# Patient Record
Sex: Male | Born: 1960
Health system: Southern US, Community
[De-identification: ages and names within clinical notes are randomized; demographics above are authoritative.]

## PROBLEM LIST (undated history)

## (undated) DIAGNOSIS — T7840XA Allergy, unspecified, initial encounter: Secondary | ICD-10-CM

## (undated) DIAGNOSIS — Z8546 Personal history of malignant neoplasm of prostate: Secondary | ICD-10-CM

## (undated) DIAGNOSIS — E785 Hyperlipidemia, unspecified: Secondary | ICD-10-CM

## (undated) DIAGNOSIS — Z87448 Personal history of other diseases of urinary system: Secondary | ICD-10-CM

## (undated) DIAGNOSIS — C61 Malignant neoplasm of prostate: Secondary | ICD-10-CM

## (undated) HISTORY — DX: Personal history of other diseases of urinary system: Z87.448

## (undated) HISTORY — PX: TONSILLECTOMY: SHX5217

## (undated) HISTORY — DX: Allergy, unspecified, initial encounter: T78.40XA

## (undated) HISTORY — DX: Malignant neoplasm of prostate: C61

## (undated) HISTORY — PX: APPENDECTOMY: SHX54

## (undated) HISTORY — DX: Hyperlipidemia, unspecified: E78.5

## (undated) HISTORY — DX: Personal history of malignant neoplasm of prostate: Z85.46

---

## 2003-01-12 ENCOUNTER — Encounter: Payer: Self-pay | Admitting: Family Medicine

## 2003-01-12 ENCOUNTER — Encounter: Admission: RE | Admit: 2003-01-12 | Discharge: 2003-01-12 | Payer: Self-pay | Admitting: Family Medicine

## 2006-01-29 ENCOUNTER — Ambulatory Visit (HOSPITAL_COMMUNITY): Admission: RE | Admit: 2006-01-29 | Discharge: 2006-01-29 | Payer: Self-pay | Admitting: Orthopedic Surgery

## 2006-12-03 ENCOUNTER — Ambulatory Visit: Payer: Self-pay | Admitting: Family Medicine

## 2006-12-03 LAB — CONVERTED CEMR LAB
Glucose, Bld: 81 mg/dL (ref 70–99)
LDL Cholesterol: 126 mg/dL — ABNORMAL HIGH (ref 0–99)
Triglycerides: 76 mg/dL (ref 0–149)
VLDL: 15 mg/dL (ref 0–40)

## 2007-10-07 ENCOUNTER — Ambulatory Visit: Payer: Self-pay | Admitting: Family Medicine

## 2007-10-07 DIAGNOSIS — B350 Tinea barbae and tinea capitis: Secondary | ICD-10-CM | POA: Insufficient documentation

## 2007-10-13 ENCOUNTER — Encounter (INDEPENDENT_AMBULATORY_CARE_PROVIDER_SITE_OTHER): Payer: Self-pay | Admitting: *Deleted

## 2007-10-28 ENCOUNTER — Encounter: Payer: Self-pay | Admitting: Internal Medicine

## 2008-04-13 ENCOUNTER — Ambulatory Visit: Payer: Self-pay | Admitting: Internal Medicine

## 2008-04-14 ENCOUNTER — Encounter: Payer: Self-pay | Admitting: Internal Medicine

## 2008-04-14 ENCOUNTER — Ambulatory Visit: Payer: Self-pay | Admitting: Internal Medicine

## 2008-04-14 DIAGNOSIS — M7120 Synovial cyst of popliteal space [Baker], unspecified knee: Secondary | ICD-10-CM | POA: Insufficient documentation

## 2008-04-14 LAB — CONVERTED CEMR LAB
ALT: 20 units/L (ref 0–53)
Alkaline Phosphatase: 46 units/L (ref 39–117)
BUN: 11 mg/dL (ref 6–23)
Basophils Relative: 0 % (ref 0.0–3.0)
Bilirubin, Direct: 0.1 mg/dL (ref 0.0–0.3)
CO2: 30 meq/L (ref 19–32)
Calcium: 9.1 mg/dL (ref 8.4–10.5)
Chloride: 104 meq/L (ref 96–112)
Cholesterol: 181 mg/dL (ref 0–200)
Eosinophils Absolute: 0.2 10*3/uL (ref 0.0–0.7)
Eosinophils Relative: 4 % (ref 0.0–5.0)
GFR calc Af Amer: 116 mL/min
GFR calc non Af Amer: 96 mL/min
Glucose, Bld: 95 mg/dL (ref 70–99)
HCT: 46.5 % (ref 39.0–52.0)
HDL: 35.7 mg/dL — ABNORMAL LOW (ref >39.0)
Lymphocytes Relative: 33 % (ref 12.0–46.0)
MCV: 91.2 fL (ref 78.0–100.0)
Neutrophils Relative %: 55.5 % (ref 43.0–77.0)
RBC: 5.1 M/uL (ref 4.22–5.81)
TSH: 1.09 microintl units/mL (ref 0.35–5.50)
Total Bilirubin: 1.1 mg/dL (ref 0.3–1.2)
Total CHOL/HDL Ratio: 5.1
VLDL: 14 mg/dL (ref 0–40)

## 2008-12-29 ENCOUNTER — Telehealth: Payer: Self-pay | Admitting: Internal Medicine

## 2009-06-20 ENCOUNTER — Ambulatory Visit: Payer: Self-pay | Admitting: Internal Medicine

## 2009-06-21 ENCOUNTER — Encounter: Payer: Self-pay | Admitting: Internal Medicine

## 2009-08-07 ENCOUNTER — Ambulatory Visit: Payer: Self-pay | Admitting: Family

## 2009-08-07 DIAGNOSIS — J209 Acute bronchitis, unspecified: Secondary | ICD-10-CM | POA: Insufficient documentation

## 2009-08-13 ENCOUNTER — Telehealth: Payer: Self-pay | Admitting: Internal Medicine

## 2009-09-12 ENCOUNTER — Telehealth: Payer: Self-pay | Admitting: Internal Medicine

## 2009-09-18 ENCOUNTER — Encounter: Payer: Self-pay | Admitting: Internal Medicine

## 2009-09-26 ENCOUNTER — Telehealth: Payer: Self-pay | Admitting: Internal Medicine

## 2009-10-26 ENCOUNTER — Telehealth: Payer: Self-pay | Admitting: Internal Medicine

## 2010-07-02 ENCOUNTER — Encounter: Payer: Self-pay | Admitting: Internal Medicine

## 2010-07-02 LAB — CONVERTED CEMR LAB
Calcium: 9.3 mg/dL (ref 8.4–10.5)
HDL: 48 mg/dL (ref >39)
LDL Cholesterol: 103 mg/dL — ABNORMAL HIGH (ref 0–99)
Sodium: 139 meq/L (ref 135–145)
Total CHOL/HDL Ratio: 3.5
VLDL: 16 mg/dL (ref 0–40)

## 2010-07-17 ENCOUNTER — Ambulatory Visit: Payer: Self-pay | Admitting: Internal Medicine

## 2010-08-14 ENCOUNTER — Telehealth: Payer: Self-pay | Admitting: Internal Medicine

## 2010-08-16 ENCOUNTER — Ambulatory Visit
Admission: RE | Admit: 2010-08-16 | Discharge: 2010-08-16 | Payer: Self-pay | Source: Home / Self Care | Attending: Internal Medicine | Admitting: Internal Medicine

## 2010-08-19 ENCOUNTER — Encounter: Payer: Self-pay | Admitting: Internal Medicine

## 2010-08-19 DIAGNOSIS — N529 Male erectile dysfunction, unspecified: Secondary | ICD-10-CM | POA: Insufficient documentation

## 2010-09-01 LAB — CONVERTED CEMR LAB
CO2: 26 meq/L (ref 19–32)
Calcium: 9.2 mg/dL (ref 8.4–10.5)
Chloride: 102 meq/L (ref 96–112)
Creatinine, Ser: 1 mg/dL (ref 0.40–1.50)
Glucose, Bld: 92 mg/dL (ref 70–99)
HDL: 47 mg/dL (ref >39)
LDL Cholesterol: 118 mg/dL — ABNORMAL HIGH (ref 0–99)
Total CHOL/HDL Ratio: 3.9
Triglycerides: 87 mg/dL (ref <150)
VLDL: 17 mg/dL (ref 0–40)

## 2010-09-03 NOTE — Progress Notes (Signed)
Summary: refill to [facility] on [address] Note Refill Request Message from:  Patient on October 26, 2009 2:30 PM  Refills Requested: Medication #1:  PATANOL 0.1 % SOLN one drop each eye two times a day   Dosage confirmed as above?Dosage Confirmed  Medication #2:  ZYRTEC-D ALLERGY & CONGESTION 5-120 MG XR12H-TAB 1/2 tablet by mouth once daily as needed   Dosage confirmed as above?Dosage Confirmed please refill too [facility] on [address]   Initial call taken by: Michaelle Copas,  October 26, 2009 2:31 PM  Follow-up for Phone Call        ok to refill x 5 Follow-up by: D. Thomos Lemons DO,  October 26, 2009 3:36 PM  Additional Follow-up for Phone Call Additional follow up Details #1::        Rx sent to pharmacy Additional Follow-up by: Glendell Docker CMA,  October 26, 2009 4:53 PM    Prescriptions: ZYRTEC-D ALLERGY & CONGESTION 5-120 MG XR12H-TAB (CETIRIZINE-PSEUDOEPHEDRINE) 1/2 tablet by mouth once daily as needed  #30 x 5   Entered by:   Glendell Docker CMA   Authorized by:   D. Thomos Lemons DO   Signed by:   Glendell Docker CMA on 10/26/2009   Method used:   Electronically to        [facility]* (retail)       [address] [place], [place]  [address]       Ph: [phone]       Fax: [phone]   RxID:   [id number] PATANOL 0.1 % SOLN (OLOPATADINE HCL) one drop each eye two times a day  #30 x 5   Entered by:   Glendell Docker CMA   Authorized by:   D. Thomos Lemons DO   Signed by:   Glendell Docker CMA on 10/26/2009   Method used:   Electronically to        [facility]* (retail)       [address] [place], [place]  [address]       Ph: [phone]       Fax: [phone]   RxID:   [id number]

## 2010-09-03 NOTE — Progress Notes (Signed)
  Phone Note Call from Patient   Caller: Patient Details for Reason: Baker's cyst Summary of Call: Pt has a Baker's cyst on right knee , want [facility] referral   [phone] Initial call taken by: Darral Dash,  September 12, 2009 3:50 PM  Follow-up for Phone Call        Appt  [facility]    Dr Cleophas Dunker    Feb  15   Follow-up by: Darral Dash,  September 14, 2009 2:06 PM

## 2010-09-03 NOTE — Progress Notes (Signed)
Summary: needs zyrtec d not zyrtec   Phone Note Refill Request Call back at [phone] Message from:  Patient on September 26, 2009 12:26 PM  His rx came in the mail it is for Zyrtec regular and he Needs Zyrtec D  [facility] [address]    Method Requested: Electronic Next Appointment Scheduled: none Initial call taken by: Roselle Locus,  September 26, 2009 12:27 PM  Follow-up for Phone Call        Rx correction updated Follow-up by: Glendell Docker CMA,  September 26, 2009 1:33 PM    New/Updated Medications: ZYRTEC-D ALLERGY & CONGESTION 5-120 MG XR12H-TAB (CETIRIZINE-PSEUDOEPHEDRINE) 1/2 tablet by mouth once daily as needed Prescriptions: ZYRTEC-D ALLERGY & CONGESTION 5-120 MG XR12H-TAB (CETIRIZINE-PSEUDOEPHEDRINE) 1/2 tablet by mouth once daily as needed  #30 x 3   Entered by:   Glendell Docker CMA   Authorized by:   D. Thomos Lemons DO   Signed by:   Glendell Docker CMA on 09/26/2009   Method used:   Electronically to        [id number]* (retail)       [address]       [place], [place]  [address]       Ph: [phone]       Fax: [phone]   RxID:   [id number]

## 2010-09-03 NOTE — Progress Notes (Signed)
Summary: Rx for Over The Counter med  Phone Note Call from Patient Call back at [phone]   Caller: Patient Summary of Call: patient called and left voice message requesting rx's for  Zyrtec D, Advil and Tylenol. He states that his health care saving will no longer cover these unless he has a prescriptions. Initial call taken by: Glendell Docker CMA,  August 13, 2009 1:35 PM  Follow-up for Phone Call        please find out how pt is taking each med and prepare rx for signature.  Follow-up by: D. Thomos Lemons DO,  August 17, 2009 12:30 PM  Additional Follow-up for Phone Call Additional follow up Details #1::        patient state he  is still having chest congestion and would like to know if he could take another round of antibiotics. He states his cough is better, but he still has some chest congestion Additional Follow-up by: Glendell Docker CMA,  August 17, 2009 1:39 PM    Additional Follow-up for Phone Call Additional follow up Details #2::    If he is still having chest - I suggest pt come in for CXR then OV Follow-up by: D. Thomos Lemons DO,  August 17, 2009 5:50 PM  Additional Follow-up for Phone Call Additional follow up Details #3:: Details for Additional Follow-up Action Taken: attempted to contact patient at [phone] no answer,detailed voice message left advising patient per Dr Artist Pais instructions Additional Follow-up by: Glendell Docker CMA,  August 20, 2009 11:40 AM  New/Updated Medications: ZYRTEC ALLERGY 10 MG TABS (CETIRIZINE HCL) 1/2 tablet by mouth once daily as needed ADVIL 200 MG CAPS (IBUPROFEN) Take 1 tablet by mouth two times a day as needed TYLENOL EXTRA STRENGTH 500 MG TABS (ACETAMINOPHEN) Take 1 tablet by mouth two times a day as needed Prescriptions: TYLENOL EXTRA STRENGTH 500 MG TABS (ACETAMINOPHEN) Take 1 tablet by mouth two times a day as needed  #60 x 3   Entered by:   Glendell Docker CMA   Authorized by:   D. Thomos Lemons DO   Signed by:   Glendell Docker CMA  on 08/17/2009   Method used:   Print then Mail to Patient   RxID:   [id number] ZYRTEC ALLERGY 10 MG TABS (CETIRIZINE HCL) 1/2 tablet by mouth once daily as needed  #30 x 3   Entered by:   Glendell Docker CMA   Authorized by:   D. Thomos Lemons DO   Signed by:   Glendell Docker CMA on 08/17/2009   Method used:   Print then Mail to Patient   RxID:   [id number] ADVIL 200 MG CAPS (IBUPROFEN) Take 1 tablet by mouth two times a day as needed  #60 x 3   Entered by:   Glendell Docker CMA   Authorized by:   D. Thomos Lemons DO   Signed by:   Glendell Docker CMA on 08/17/2009   Method used:   Print then Mail to Patient   RxID:   [id number]

## 2010-09-03 NOTE — Miscellaneous (Signed)
Summary: Orders Update  Clinical Lists Changes  Orders: Added new Test order of T-Lipid Profile [id number]) - Signed Added new Test order of T-Basic Metabolic Panel [phone]) - Signed

## 2010-09-03 NOTE — Assessment & Plan Note (Signed)
Summary: flu like symptoms/mhf   Vital Signs:  Patient profile:   50 year old male Weight:      146 pounds Temp:     98.1 degrees F oral BP sitting:   124 / 74  (right arm)  Vitals Entered By: Doristine Devoid (August 07, 2009 4:27 PM) CC: cough and sinus drainage   Primary Care Provider:  Dondra Spry DO  CC:  cough and sinus drainage.  History of Present Illness: Patient presents today with a 1 week history of cough, "scratchy throat", denies nasal congestion.  Cough productive of yellow phlegm.  Had one episode of streak of blood in sputum.  Wife had the same illness.    Allergies: No Known Drug Allergies  Review of Systems       + cough, no fever    Physical Exam  General:  Well-developed,well-nourished,in no acute distress; alert,appropriate and cooperative throughout examination Head:  Normocephalic and atraumatic without obvious abnormalities. No apparent alopecia or balding. Eyes:  Perrla Ears:  L TM dull, no erythema R ear normal.   Mouth:  Oral mucosa and oropharynx without lesions or exudates.  Teeth in good repair. Neck:  No deformities, masses, or tenderness noted. Lungs:  Normal respiratory effort, chest expands symmetrically. Lungs are clear to auscultation, no crackles or wheezes. Heart:  Normal rate and regular rhythm. S1 and S2 normal without gallop, murmur, click, rub or other extra sounds. Abdomen:  Bowel sounds positive,abdomen soft and non-tender without masses, organomegaly or hernias noted. Msk:  No deformity or scoliosis noted of thoracic or lumbar spine.   Skin:  Intact without suspicious lesions or rashes Psych:  Cognition and judgment appear intact. Alert and cooperative with normal attention span and concentration. No apparent delusions, illusions, hallucinations   Impression & Recommendations:  Problem # 1:  ACUTE BRONCHITIS (ICD-466.0) Assessment New Suspect mild bronchitis will treat with z-pak, patient instructed to call for follow up if  symptoms worsen or do not improve. His updated medication list for this problem includes:    Zithromax Z-pak 250 Mg Tabs (Azithromycin) .[name] Kitchen... 2 tabs by mouth today, then one tablet by mouth daily for 4 more days.  Complete Medication List: 1)  Zyrtec Allergy 10 Mg Tabs (Cetirizine hcl) .... 1/2 tablet by mouth once daily as needed 2)  Rhinocort Aqua 32 Mcg/act Susp (Budesonide) .... One spray to each nostril once daily 3)  Patanol 0.1 % Soln (Olopatadine hcl) .... One drop each eye two times a day 4)  Zithromax Z-pak 250 Mg Tabs (Azithromycin) .... 2 tabs by mouth today, then one tablet by mouth daily for 4 more days.  Patient Instructions: 1)  call if symptoms worsen or do not improve. Prescriptions: ZITHROMAX Z-PAK 250 MG TABS (AZITHROMYCIN) 2 tabs by mouth today, then one tablet by mouth daily for 4 more days.  #1 x 0   Entered and Authorized by:   Lemont Fillers FNP   Signed by:   Lemont Fillers FNP on 08/07/2009   Method used:   Electronically to        [id number]* (retail)       [address]       [place], [place]  [address]       Ph: [phone]       Fax: [phone]   RxID:   [id number]

## 2010-09-03 NOTE — Consult Note (Signed)
Summary: [facility]  [facility]   Imported By: Lanelle Bal 09/27/2009 12:18:14  _____________________________________________________________________  External Attachment:    Type:   Image     Comment:   External Document

## 2010-09-05 NOTE — Progress Notes (Signed)
Summary: PPD Skin Test  Phone Note Call from Patient Call back at [phone]   Caller: Patient Call For: D. Thomos Lemons DO Summary of Call: patient called and left voice message regarding getting a PPD skin test done before his office visit, in order that he may have it read at the office  visit. Call was returned to patient at [phone], no answer. A detailed voice message was left informing patient to call to schedule PPD reading for Friday in order to have it read on Monday. Message was left advising patient to call to schedule nurse visit for placement Initial call taken by: Glendell Docker CMA,  August 14, 2010 12:10 PM

## 2010-09-05 NOTE — Assessment & Plan Note (Signed)
Summary: PPD PLACEMENT/DK  Nurse Visit   Allergies: No Known Drug Allergies  Immunizations Administered:  PPD Skin Test:    Vaccine Type: PPD    Site: left forearm    Mfr: Sanofi Pasteur    Dose: 0.1 ml    Route: ID    Given by: Glendell Docker CMA    Exp. Date: 04/04/2012    Lot #: [id number]  Orders Added: 1)  TB Skin Test [86580] 2)  Admin 1st Vaccine [phone]

## 2010-09-05 NOTE — Letter (Signed)
Summary: Physical Exam Form/[employer]  Physical Exam Form/[employer]   Imported By: Lanelle Bal 08/28/2010 08:21:43  _____________________________________________________________________  External Attachment:    Type:   Image     Comment:   External Document

## 2010-09-11 NOTE — Assessment & Plan Note (Signed)
Summary: John [address]   Vital Signs:  Patient profile:   50 year old male Height:      65 inches Weight:      147.50 pounds BMI:     24.63 O2 Sat:      100 % on Room air Temp:     97.8 degrees F oral Pulse rate:   98 / minute Resp:     18 per minute BP sitting:   110 / 70  (right arm) Cuff size:   large  Vitals Entered By: Glendell Docker CMA (August 19, 2010 2:05 PM)  O2 Flow:  Room air  Primary Care Provider:  D. Thomos Lemons DO   History of Present Illness: 50 y/o male for routine John   Preventive Screening-Counseling & Management  Alcohol-Tobacco     Alcohol drinks/day: <1     Smoking Status: quit  Caffeine-Diet-Exercise     Caffeine use/day: 2-3 beverages daily     Does Patient Exercise: yes     Exercise (avg: min/session): 30-60     Times/week: 6  Allergies (verified): No Known Drug Allergies  Past History:  Past Surgical History: Appendectomy  Tonsillectomy    Family History: Family History of CAD Male 1st degree relative <50 (Brother)   smoker and drinker Family History Diabetes 1st degree relative - Father Family History Hypertension -  Mother and Father    Social History: Married Former Smoker quit 18 yrs ago  smoked for 10 yrs - 1/2 ppd Alcohol use-yes 3 children (sons)    Review of Systems  The patient denies fever, weight loss, weight gain, chest pain, syncope, dyspnea on exertion, prolonged cough, abdominal pain, melena, hematochezia, severe indigestion/heartburn, genital sores, and depression.    Physical Exam  General:  alert, well-developed, and well-nourished.   Head:  normocephalic and atraumatic.   Eyes:  pupils equal, pupils round, and pupils reactive to light.   Ears:  R ear normal and L ear normal.   Mouth:  pharynx pink and moist and no erythema.   Neck:  No deformities, masses, or tenderness noted. Lungs:  normal respiratory effort and normal breath sounds.   Heart:  normal rate, regular rhythm, and no gallop.   Abdomen:   soft, non-tender, normal bowel sounds, no masses, no hepatomegaly, and no splenomegaly.   Extremities:  No lower extremity edema Psych:  normally interactive, good eye contact, not anxious appearing, and not depressed appearing.     Impression & Recommendations:  Problem # 1:  HEALTH MAINTENANCE EXAM (ICD-V70.0) Reviewed adult health maintenance protocols.  Td Booster: Tdap (02/14/2003)   Flu Vax: Fluvax Non-MCR (06/20/2009)   Chol: 167 (07/02/2010)   HDL: 48 (07/02/2010)   LDL: 103 (07/02/2010)   TG: 79 (07/02/2010) TSH: 1.09 (04/14/2008)   PSA: 1.60 (12/03/2006)  Problem # 2:  ERECTILE DYSFUNCTION, ORGANIC (ICD-607.84)  His updated medication list for this problem includes:    Cialis 5 Mg Tabs (Tadalafil) ..... One by mouth once daily as needed  Complete Medication List: 1)  Zyrtec-d Allergy & Congestion 5-120 Mg Xr12h-tab (Cetirizine-pseudoephedrine) .... 1/2 tablet by mouth once daily as needed 2)  Rhinocort Aqua 32 Mcg/act Susp (Budesonide) .... One spray to each nostril once daily 3)  Patanol 0.1 % Soln (Olopatadine hcl) .... One drop each eye two times a day 4)  Advil 200 Mg Caps (Ibuprofen) .... Take 1 tablet by mouth two times a day as needed 5)  Tylenol Extra Strength 500 Mg Tabs (Acetaminophen) .... Take 1 tablet  by mouth two times a day as needed 6)  Cialis 5 Mg Tabs (Tadalafil) .... One by mouth once daily as needed  Patient Instructions: 1)  Please schedule a follow-up appointment in 1 year. 2)  Please return for lab work one (1) week before your next appointment.  3)  BMP prior to visit, ICD-9: v70 4)  Lipid Panel prior to visit, ICD-9: v70 5)  PSA prior to visit, ICD-9: v76.44 6)  Total testosterone level:  607.84 Prescriptions: CIALIS 5 MG TABS (TADALAFIL) one by mouth once daily as needed  #30 x 0   Entered and Authorized by:   D. Thomos Lemons DO   Signed by:   D. Thomos Lemons DO on 08/19/2010   Method used:   Print then Give to Patient   RxID:    [id number]    Orders Added: 1)  Est. Patient 40-64 years [99396]   PPD Results    Date of reading: 08/19/2010    Results: < 5mm    Interpretation: negative    Current Allergies (reviewed today): No known allergies

## 2010-10-04 ENCOUNTER — Telehealth: Payer: Self-pay | Admitting: Internal Medicine

## 2010-10-10 NOTE — Progress Notes (Signed)
Summary: Zyrtex- Out of Town refill  Phone Note Call from Patient Call back at [phone]   Caller: Patient Call For: D. 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 He would like to know if Dr Camie Patience authorize a refill to [facility] [phone]) Initial call taken by: Glendell Docker CMA,  October 04, 2010 3:54 PM  Follow-up for Phone Call        ok to send rx  Follow-up by: D. Thomos Lemons DO,  October 04, 2010 4:36 PM  Additional Follow-up for Phone Call Additional follow up Details #1::        call place to patient at [phone], no answer. A detailed voice message  was  left informing patient rx sent to pharmacy Additional Follow-up by: Glendell Docker CMA,  October 04, 2010 4:52 PM    Prescriptions: ZYRTEC-D ALLERGY & CONGESTION 5-120 MG XR12H-TAB (CETIRIZINE-PSEUDOEPHEDRINE) 1/2 tablet by mouth once daily as needed  #30 x 0   Entered by:   Glendell Docker CMA   Authorized by:   D. Thomos Lemons DO   Signed by:   Glendell Docker CMA on 10/04/2010   Method used:   Printed then faxed to ...       [facility]* (retail)       [address] [place], [place]  [address]       Ph: [phone]       Fax: [phone]   RxID:   [id number]

## 2011-01-22 ENCOUNTER — Other Ambulatory Visit: Payer: Self-pay | Admitting: Internal Medicine

## 2011-01-24 ENCOUNTER — Telehealth: Payer: Self-pay | Admitting: Internal Medicine

## 2011-01-24 MED ORDER — CETIRIZINE-PSEUDOEPHEDRINE ER 5-120 MG PO TB12: ORAL_TABLET | ORAL | Status: DC

## 2011-01-24 NOTE — Telephone Encounter (Signed)
Refills sent to pharmacy. 

## 2011-01-24 NOTE — Telephone Encounter (Signed)
Pt states that [facility] just [facility] and the pharmacy has told him that they have not received the zyrtec-d rx.

## 2011-01-24 NOTE — Telephone Encounter (Signed)
Rx sent to pharmacy   

## 2011-01-24 NOTE — Telephone Encounter (Signed)
Please refill as directed

## 2011-01-24 NOTE — Telephone Encounter (Signed)
OK to refill all three medications, disp same sig and same amount but with just 2 refills

## 2011-01-24 NOTE — Telephone Encounter (Signed)
Patient states he needs rx's called into [facility] by today because he will be going out of town this weekend. Pt also states that he needs a refill for Zyrtec-D.

## 2011-07-09 ENCOUNTER — Telehealth: Payer: Self-pay | Admitting: Internal Medicine

## 2011-07-09 MED ORDER — PSEUDOEPHEDRINE-IBUPROFEN 30-200 MG PO CAPS: ORAL_CAPSULE | ORAL | Status: DC

## 2011-07-09 NOTE — Telephone Encounter (Signed)
Dr Rodena Medin would it be okay to provide this Rx for patient?

## 2011-07-09 NOTE — Telephone Encounter (Signed)
ok 

## 2011-07-09 NOTE — Telephone Encounter (Signed)
Rx sent to pharmacy   

## 2011-07-09 NOTE — Telephone Encounter (Signed)
Patient states that he would like to buy advil with his Flex Spending(Benny card) but needs a prescription for the med. Patient would like Dr. Rodena Medin to write him an Rx for Advil liquid gel #160. [facility] [address].

## 2011-07-11 ENCOUNTER — Telehealth: Payer: Self-pay | Admitting: *Deleted

## 2011-07-11 MED ORDER — IBUPROFEN 200 MG PO TABS: 200.0000 mg | ORAL_TABLET | Freq: Four times a day (QID) | ORAL | Status: AC | PRN

## 2011-07-11 NOTE — Telephone Encounter (Signed)
Patient called and left voice message stating he has received the medication that was sent to the pharmacy. However he would like to know if he could get a rx for over the counter advil/ibuprofen as well.  Over the counter Rx sent to pharmacy.

## 2011-07-15 ENCOUNTER — Telehealth: Payer: Self-pay | Admitting: Internal Medicine

## 2011-07-15 MED ORDER — ASPIRIN 81 MG PO TABS: 81.0000 mg | ORAL_TABLET | ORAL | Status: DC | PRN

## 2011-07-15 NOTE — Telephone Encounter (Signed)
Is it okay to provide a Rx for over the counter medication per patient request?

## 2011-07-15 NOTE — Telephone Encounter (Signed)
Patient would like an rx for Bayer Aspirin 81mg  qty 240 so he can pay with his benny card(spending account). [facility] [address].

## 2011-07-15 NOTE — Telephone Encounter (Signed)
Rx sent to pharmacy   

## 2011-07-15 NOTE — Telephone Encounter (Signed)
yes

## 2011-07-22 ENCOUNTER — Telehealth: Payer: Self-pay | Admitting: *Deleted

## 2011-07-22 MED ORDER — ZYRTEC-D ALLERGY & CONGESTION 5-120 MG PO TB12: ORAL_TABLET | ORAL | Status: DC

## 2011-07-22 NOTE — Telephone Encounter (Signed)
Patient called and left voice message requesting a Rx for Zyrtec D 24 tablet. He stated that he will run out prior to his January appointment and would like to know if he could get refills on the medication.   Per Dr Rodena Medin approval. Molli Knock to provide to patient.   Rx for Zyrtec sent to pharmacy.

## 2011-07-23 ENCOUNTER — Telehealth: Payer: Self-pay | Admitting: Internal Medicine

## 2011-07-23 MED ORDER — ZYRTEC-D ALLERGY & CONGESTION 5-120 MG PO TB12: 1.0000 | ORAL_TABLET | Freq: Every day | ORAL | Status: DC | PRN

## 2011-07-23 NOTE — Telephone Encounter (Signed)
Zyrtec-d allergy and congestion 5-120mg  per tablet. Qty 30 1/2 tablet by mouth daily as needed.  Pharmacy comments:  Cannot cut these in half. What would you like to do?

## 2011-07-23 NOTE — Telephone Encounter (Signed)
Corrected Rx resubmitted to pharmacy.

## 2011-07-25 ENCOUNTER — Encounter: Payer: Self-pay | Admitting: Internal Medicine

## 2011-07-31 ENCOUNTER — Other Ambulatory Visit: Payer: Self-pay | Admitting: Family Medicine

## 2011-08-22 ENCOUNTER — Encounter: Payer: Self-pay | Admitting: Internal Medicine

## 2011-08-25 ENCOUNTER — Other Ambulatory Visit: Payer: Self-pay | Admitting: Internal Medicine

## 2011-08-25 DIAGNOSIS — Z125 Encounter for screening for malignant neoplasm of prostate: Secondary | ICD-10-CM

## 2011-08-25 DIAGNOSIS — Z0000 Encounter for general adult medical examination without abnormal findings: Secondary | ICD-10-CM

## 2011-08-25 LAB — LIPID PANEL
LDL Cholesterol: 105 mg/dL — ABNORMAL HIGH (ref 0–99)
Triglycerides: 63 mg/dL (ref <150)
VLDL: 13 mg/dL (ref 0–40)

## 2011-08-25 LAB — BASIC METABOLIC PANEL
BUN: 14 mg/dL (ref 6–23)
CO2: 26 mEq/L (ref 19–32)
Chloride: 101 mEq/L (ref 96–112)
Potassium: 4.1 mEq/L (ref 3.5–5.3)

## 2011-08-25 LAB — CBC
HCT: 44.1 % (ref 39.0–52.0)
Hemoglobin: 15.1 g/dL (ref 13.0–17.0)
MCH: 30.6 pg (ref 26.0–34.0)
MCHC: 34.2 g/dL (ref 30.0–36.0)
MCV: 89.3 fL (ref 78.0–100.0)

## 2011-08-25 LAB — HEPATIC FUNCTION PANEL
Albumin: 4.1 g/dL (ref 3.5–5.2)
Indirect Bilirubin: 0.4 mg/dL (ref 0.0–0.9)
Total Bilirubin: 0.5 mg/dL (ref 0.3–1.2)
Total Protein: 6.5 g/dL (ref 6.0–8.3)

## 2011-08-26 LAB — URINALYSIS, ROUTINE W REFLEX MICROSCOPIC
Leukocytes, UA: NEGATIVE
Nitrite: NEGATIVE
Specific Gravity, Urine: 1.007 (ref 1.005–1.030)
pH: 7 (ref 5.0–8.0)

## 2011-08-26 LAB — URINALYSIS, MICROSCOPIC ONLY
Casts: NONE SEEN
Crystals: NONE SEEN

## 2011-08-26 LAB — PSA: PSA: 5.17 ng/mL — ABNORMAL HIGH (ref <=4.00)

## 2011-08-28 ENCOUNTER — Ambulatory Visit (INDEPENDENT_AMBULATORY_CARE_PROVIDER_SITE_OTHER): Payer: Self-pay | Admitting: Internal Medicine

## 2011-08-28 ENCOUNTER — Encounter: Payer: Self-pay | Admitting: Internal Medicine

## 2011-08-28 VITALS — BP 122/90 | HR 95 | Temp 98.1°F | Resp 18 | Ht 65.0 in | Wt 146.0 lb

## 2011-08-28 DIAGNOSIS — N529 Male erectile dysfunction, unspecified: Secondary | ICD-10-CM

## 2011-08-28 DIAGNOSIS — Z23 Encounter for immunization: Secondary | ICD-10-CM

## 2011-08-28 DIAGNOSIS — Z8546 Personal history of malignant neoplasm of prostate: Secondary | ICD-10-CM | POA: Insufficient documentation

## 2011-08-28 DIAGNOSIS — Z0000 Encounter for general adult medical examination without abnormal findings: Secondary | ICD-10-CM | POA: Insufficient documentation

## 2011-08-28 DIAGNOSIS — Z1211 Encounter for screening for malignant neoplasm of colon: Secondary | ICD-10-CM

## 2011-08-28 DIAGNOSIS — R972 Elevated prostate specific antigen [PSA]: Secondary | ICD-10-CM

## 2011-08-28 HISTORY — DX: Personal history of malignant neoplasm of prostate: Z85.46

## 2011-08-28 MED ORDER — TADALAFIL 5 MG PO TABS: 5.0000 mg | ORAL_TABLET | Freq: Every day | ORAL | Status: DC | PRN

## 2011-08-28 NOTE — Assessment & Plan Note (Signed)
Voucher and prescription provided for cialis 5mg . Aware of correct dosing instructions.

## 2011-08-28 NOTE — Progress Notes (Signed)
  Subjective:    Patient ID: John Lane, male    DOB: 07-12-1961, 51 y.o.   MRN: [id number]  HPI Pt presents to clinic for annual physical exam. Has [employer] physical form to be completed. Has ED s/p attempt of cialis 5mg  in the past without adverse effects. Has not undergone colonoscopy-asx and no fam hx of colon cancer. Reviewed cpe labs including mildly elevated psa 5.17 for which he is asx. Last psa for comparison 1.6 ~4 years ago. BP minimally elevated and states is typically normotensive. No other complaints.  No past medical history on file. Past Surgical History  Procedure Date  . Appendectomy   . Tonsillectomy     reports that he has quit smoking. He has never used smokeless tobacco. He reports that he drinks alcohol. He reports that he does not use illicit drugs. family history includes Coronary artery disease in his brother; Diabetes in his father; and Hypertension in his father and mother. No Known Allergies   Review of Systems see hpi     Objective:   Physical Exam  Physical Exam  Nursing note and vitals reviewed. Constitutional: He appears well-developed and well-nourished. No distress.  HENT:  Head: Normocephalic and atraumatic.  Right Ear: Tympanic membrane and external ear normal.  Left Ear: Tympanic membrane and external ear normal.  Nose: Nose normal.  Mouth/Throat: Uvula is midline, oropharynx is clear and moist and mucous membranes are normal. No oropharyngeal exudate.  Eyes: Conjunctivae and EOM are normal. Pupils are equal, round, and reactive to light. Right eye exhibits no discharge. Left eye exhibits no discharge. No scleral icterus.  Neck: Neck supple. Carotid bruit is not present. No thyromegaly present.  Cardiovascular: Normal rate, regular rhythm and normal heart sounds.  Exam reveals no gallop and no friction rub.   No murmur heard. Pulmonary/Chest: Effort normal and breath sounds normal. No respiratory distress. He has no wheezes. He has no rales.    Abdominal: Soft. He exhibits no distension and no mass. There is no hepatosplenomegaly. There is no tenderness. There is no rebound. Hernia confirmed negative in the right inguinal area and confirmed negative in the left inguinal area.  Genitourinary: Rectum normal, prostate normal and testes normal. Rectal exam shows no mass and no tenderness. Guaiac negative stool. Prostate is not enlarged and not tender. Right testis shows no mass, no swelling and no tenderness. Right testis is descended. Left testis shows no mass, no swelling and no tenderness. Left testis is descended.  Lymphadenopathy:    He has no cervical adenopathy.       Right: No inguinal adenopathy present.       Left: No inguinal adenopathy present.  Neurological: He is alert.  Skin: Skin is warm and dry. He is not diaphoretic.  Psychiatric: He has a normal mood and affect.       Assessment & Plan:

## 2011-08-28 NOTE — Assessment & Plan Note (Signed)
Nl exam. Recommend outpt bp log. Refer for screening colonoscopy

## 2011-08-28 NOTE — Patient Instructions (Signed)
Please schedule fasting labs prior to your next year's physical Cbc, chem7, lipid, lft, tsh, urinalysis with reflex, psa -v70.0

## 2011-08-28 NOTE — Assessment & Plan Note (Signed)
Discussed implications of elevated psa including potential prostate cancer as wel as false positive result. Proceed with urology consult for further evaluation

## 2011-08-29 ENCOUNTER — Encounter: Payer: Self-pay | Admitting: Internal Medicine

## 2011-09-29 ENCOUNTER — Other Ambulatory Visit: Payer: Self-pay | Admitting: *Deleted

## 2011-09-29 MED ORDER — ZYRTEC-D ALLERGY & CONGESTION 5-120 MG PO TB12: 1.0000 | ORAL_TABLET | Freq: Every day | ORAL | Status: DC | PRN

## 2011-09-29 MED ORDER — ASPIRIN 81 MG PO TABS: 81.0000 mg | ORAL_TABLET | ORAL | Status: DC | PRN

## 2011-09-29 NOTE — Telephone Encounter (Signed)
Patient called and left voice message requesting a refill on Zytrec D  And Monia Sabal to his pharmacy.  Rx refills sent to pharmacy.

## 2011-10-02 ENCOUNTER — Other Ambulatory Visit: Payer: Self-pay | Admitting: Internal Medicine

## 2011-10-02 ENCOUNTER — Other Ambulatory Visit: Payer: Self-pay | Admitting: *Deleted

## 2011-10-02 MED ORDER — BUDESONIDE 32 MCG/ACT NA SUSP: 1.0000 | Freq: Every day | NASAL | Status: DC

## 2011-10-02 MED ORDER — CICLESONIDE 50 MCG/ACT NA SUSP: 2.0000 | Freq: Every day | NASAL | Status: DC

## 2011-10-02 NOTE — Telephone Encounter (Signed)
Call placed to patient at [phone], no answer. A detailed voice message was left informing patient per Dr Rodena Medin instructions. He was advised to call back if any questions.  Rx for [facility] sent to pharmacy.

## 2011-10-02 NOTE — Telephone Encounter (Signed)
Patient called and left voice message requesting a refill on Rhinocort nasal spray to [facility] pharmacy. He is also requesting samples of Solaris nasal spray if available.   Call returned to patient at [phone], he was informed no samples of requested nasal spray is available. He would like to know if Dr Rodena Medin would provide him a Rx for Solaris nasal spray to have on hand to try. If approved he is requesting Rx to [facility]

## 2011-10-02 NOTE — Telephone Encounter (Signed)
Not sure what solaris. If omnaris then 2 sprays each nostril qd #1  rf 6. But don't use it with other inhaled steroids like nasacort, nasonex, flonase, veramyst

## 2011-10-10 ENCOUNTER — Other Ambulatory Visit: Payer: Self-pay | Admitting: Internal Medicine

## 2011-10-10 NOTE — Telephone Encounter (Signed)
Rx refill sent to pharmacy. 

## 2011-10-13 ENCOUNTER — Ambulatory Visit (AMBULATORY_SURGERY_CENTER): Payer: BC Managed Care – PPO | Admitting: *Deleted

## 2011-10-13 ENCOUNTER — Encounter: Payer: Self-pay | Admitting: Gastroenterology

## 2011-10-13 ENCOUNTER — Telehealth: Payer: Self-pay | Admitting: *Deleted

## 2011-10-13 VITALS — Ht 65.0 in | Wt 149.0 lb

## 2011-10-13 DIAGNOSIS — Z1211 Encounter for screening for malignant neoplasm of colon: Secondary | ICD-10-CM

## 2011-10-13 MED ORDER — PEG-KCL-NACL-NASULF-NA ASC-C 100 G PO SOLR: ORAL | Status: DC

## 2011-10-13 NOTE — Telephone Encounter (Signed)
Explained Moderate sedation and answered his questions.

## 2011-10-27 ENCOUNTER — Telehealth: Payer: Self-pay | Admitting: *Deleted

## 2011-10-27 MED ORDER — IBUPROFEN 200 MG PO CAPS: 200.0000 mg | ORAL_CAPSULE | Freq: Four times a day (QID) | ORAL | Status: DC | PRN

## 2011-10-27 MED ORDER — ACETAMINOPHEN 500 MG PO TABS: 500.0000 mg | ORAL_TABLET | Freq: Four times a day (QID) | ORAL | Status: DC | PRN

## 2011-10-27 NOTE — Telephone Encounter (Signed)
Ok 1 q6 hours prn pain for both

## 2011-10-27 NOTE — Telephone Encounter (Signed)
Rx sent to pharmacy   

## 2011-10-27 NOTE — Telephone Encounter (Signed)
Patient called and left voice message requesting a Rx to [facility] for Advil liquid gels qty 240 and extra strength Tylenol qty 325. He stated that he would like to use his flex spending and a Rx is needed in order for him to do that.

## 2011-10-30 ENCOUNTER — Encounter: Payer: Self-pay | Admitting: Gastroenterology

## 2011-10-30 ENCOUNTER — Ambulatory Visit (AMBULATORY_SURGERY_CENTER): Payer: BC Managed Care – PPO | Admitting: Gastroenterology

## 2011-10-30 VITALS — BP 135/97 | HR 77 | Temp 96.4°F | Resp 20 | Ht 65.0 in | Wt 149.0 lb

## 2011-10-30 DIAGNOSIS — D126 Benign neoplasm of colon, unspecified: Secondary | ICD-10-CM

## 2011-10-30 DIAGNOSIS — Z1211 Encounter for screening for malignant neoplasm of colon: Secondary | ICD-10-CM

## 2011-10-30 MED ORDER — SODIUM CHLORIDE 0.9 % IV SOLN: 500.0000 mL | INTRAVENOUS | Status: DC

## 2011-10-30 NOTE — Progress Notes (Signed)
Patient did not have preoperative order for IV antibiotic SSI prophylaxis. (G8918)  Patient did not experience any of the following events: a burn prior to discharge; a fall within the facility; wrong site/side/patient/procedure/implant event; or a hospital transfer or hospital admission upon discharge from the facility. (G8907)  

## 2011-10-30 NOTE — Patient Instructions (Signed)
YOU HAD AN ENDOSCOPIC PROCEDURE TODAY AT THE [facility]: Refer to the procedure report that was given to you for any specific questions about what was found during the examination.  If the procedure report does not answer your questions, please call your gastroenterologist to clarify.  If you requested that your care partner not be given the details of your procedure findings, then the procedure report has been included in a sealed envelope for you to review at your convenience later.  YOU SHOULD EXPECT: Some feelings of bloating in the abdomen. Passage of more gas than usual.  Walking can help get rid of the air that was put into your GI tract during the procedure and reduce the bloating. If you had a lower endoscopy (such as a colonoscopy or flexible sigmoidoscopy) you may notice spotting of blood in your stool or on the toilet paper. If you underwent a bowel prep for your procedure, then you may not have a normal bowel movement for a few days.  DIET: Your first meal following the procedure should be a light meal and then it is ok to progress to your normal diet.  A half-sandwich or bowl of soup is an example of a good first meal.  Heavy or fried foods are harder to digest and may make you feel nauseous or bloated.  Likewise meals heavy in dairy and vegetables can cause extra gas to form and this can also increase the bloating.  Drink plenty of fluids but you should avoid alcoholic beverages for 24 hours.  ACTIVITY: Your care partner should take you home directly after the procedure.  You should plan to take it easy, moving slowly for the rest of the day.  You can resume normal activity the day after the procedure however you should NOT DRIVE or use heavy machinery for 24 hours (because of the sedation medicines used during the test).    SYMPTOMS TO REPORT IMMEDIATELY: A gastroenterologist can be reached at any hour.  During normal business hours, 8:30 AM to 5:00 PM Monday through Friday,  call [phone].  After hours and on weekends, please call the GI answering service at [phone] who will take a message and have the physician on call contact you.   Following lower endoscopy (colonoscopy or flexible sigmoidoscopy):  Excessive amounts of blood in the stool  Significant tenderness or worsening of abdominal pains  Swelling of the abdomen that is new, acute  Fever of 100F or higher    FOLLOW UP: If any biopsies were taken you will be contacted by phone or by letter within the next 1-3 weeks.  Call your gastroenterologist if you have not heard about the biopsies in 3 weeks.  Our staff will call the home number listed on your records the next business day following your procedure to check on you and address any questions or concerns that you may have at that time regarding the information given to you following your procedure. This is a courtesy call and so if there is no answer at the home number and we have not heard from you through the emergency physician on call, we will assume that you have returned to your regular daily activities without incident.  SIGNATURES/CONFIDENTIALITY: You and/or your care partner have signed paperwork which will be entered into your electronic medical record.  These signatures attest to the fact that that the information above on your After Visit Summary has been reviewed and is understood.  Full responsibility of the confidentiality   of this discharge information lies with you and/or your care-partner.     

## 2011-10-30 NOTE — Op Note (Signed)
[facility] [address] [place], [place]  [address]  COLONOSCOPY PROCEDURE REPORT  PATIENT:  John Lane, John Lane  MR#:  [id number] BIRTHDATE:  [birth year], 50 yrs. old  GENDER:  male ENDOSCOPIST:  Judie Petit T. Russella Dar, MD, [facility] Referred by:  Charlynn Court, M.D. PROCEDURE DATE:  10/30/2011 PROCEDURE:  Colonoscopy with snare polypectomy ASA CLASS:  Class II INDICATIONS:  1) Routine Risk Screening MEDICATIONS:   These medications were titrated to patient response per physician's verbal order, Fentanyl 75 mcg IV, Versed 7 mg IV DESCRIPTION OF PROCEDURE:   After the risks benefits and alternatives of the procedure were thoroughly explained, informed consent was obtained.  Digital rectal exam was performed and revealed no abnormalities.   The LB CF-[id number] endoscope was introduced through the anus and advanced to the cecum, which was identified by both the appendix and ileocecal valve, without limitations.  The quality of the prep was excellent, using MoviPrep.  The instrument was then slowly withdrawn as the colon was fully examined. <<PROCEDUREIMAGES>> FINDINGS:  A sessile polyp was found in the descending colon. It was 5 mm in size. Polyp was snared without cautery. Retrieval was successful.  Otherwise normal colonoscopy without other polyps, masses, vascular ectasias, or inflammatory changes.  Retroflexed views in the rectum revealed no abnormalities. The time to cecum = 2.75  minutes. The scope was then withdrawn (time =  9.5  min) from the patient and the procedure completed.  COMPLICATIONS:  None  ENDOSCOPIC IMPRESSION: 1) 5 mm sessile polyp in the descending colon  RECOMMENDATIONS: 1) Await pathology results 2) If the polyp is adenomatous (pre-cancerous), repeat colonoscopy in 5 years. Otherwise follow colorectal cancer screening guidelines for "routine risk" patients with colonoscopy in 10 years.  Venita Lick. Russella Dar, MD, [facility]  n. eSIGNED:   Venita Lick. Kayline Sheer at  10/30/2011 08:52 AM  Carlton Adam, [phone]

## 2011-11-03 ENCOUNTER — Telehealth: Payer: Self-pay | Admitting: *Deleted

## 2011-11-03 NOTE — Telephone Encounter (Signed)
Message to call as needed.

## 2011-11-06 ENCOUNTER — Encounter: Payer: Self-pay | Admitting: Gastroenterology

## 2011-12-24 DIAGNOSIS — C61 Malignant neoplasm of prostate: Secondary | ICD-10-CM

## 2011-12-24 HISTORY — DX: Malignant neoplasm of prostate: C61

## 2012-01-07 ENCOUNTER — Other Ambulatory Visit (HOSPITAL_COMMUNITY): Payer: Self-pay | Admitting: Urology

## 2012-01-07 DIAGNOSIS — C61 Malignant neoplasm of prostate: Secondary | ICD-10-CM

## 2012-01-30 ENCOUNTER — Encounter (HOSPITAL_COMMUNITY)
Admission: RE | Admit: 2012-01-30 | Discharge: 2012-01-30 | Disposition: A | Discharge status: Home / Self Care | Payer: BC Managed Care – PPO | Source: Ambulatory Visit | Attending: Urology | Admitting: Urology

## 2012-01-30 ENCOUNTER — Encounter (HOSPITAL_COMMUNITY): Payer: Self-pay

## 2012-01-30 DIAGNOSIS — C61 Malignant neoplasm of prostate: Secondary | ICD-10-CM | POA: Insufficient documentation

## 2012-01-30 MED ORDER — TECHNETIUM TC 99M MEDRONATE IV KIT
23.4000 | PACK | Freq: Once | INTRAVENOUS | Status: AC | PRN
  Administered 2012-01-30: 23.4 via INTRAVENOUS

## 2012-02-27 ENCOUNTER — Encounter: Payer: Self-pay | Admitting: Radiation Oncology

## 2012-03-01 ENCOUNTER — Ambulatory Visit: Payer: BC Managed Care – PPO | Admitting: Radiation Oncology

## 2012-03-01 ENCOUNTER — Ambulatory Visit: Payer: BC Managed Care – PPO

## 2012-03-09 ENCOUNTER — Other Ambulatory Visit: Payer: Self-pay | Admitting: *Deleted

## 2012-03-09 MED ORDER — TADALAFIL 5 MG PO TABS: ORAL_TABLET | ORAL | Status: DC

## 2012-03-09 NOTE — Telephone Encounter (Signed)
Ok to rf as previous

## 2012-03-09 NOTE — Telephone Encounter (Signed)
Patient request Samples of Cialis, none available; Ok to send Rx to pharmacy/SLS Please advise.

## 2012-03-09 NOTE — Telephone Encounter (Signed)
Rx sent to pharmacy; [facility] with contact name & number to inform patient/SLS

## 2012-03-15 ENCOUNTER — Other Ambulatory Visit: Payer: Self-pay | Admitting: Urology

## 2012-04-15 ENCOUNTER — Other Ambulatory Visit: Payer: Self-pay | Admitting: Urology

## 2012-05-07 ENCOUNTER — Encounter (HOSPITAL_COMMUNITY): Payer: Self-pay | Admitting: Pharmacy Technician

## 2012-05-07 NOTE — Patient Instructions (Signed)
[address]  05/07/2012   Your procedure is scheduled on:  05/12/12 0830am-1240pm  Report to [facility] at 0630 AM.  Call this number if you have problems the morning of surgery: [phone]   Remember:   Do not eat food:After Midnight.  May have clear liquids:until Midnight .    Take these medicines the morning of surgery with A SIP OF WATER:   Do not wear jewelry,   Do not wear lotions, powders, or perfumes.   . Men may shave face and neck.  Do not bring valuables to the hospital.  Contacts, dentures or bridgework may not be worn into surgery.  Leave suitcase in the car. After surgery it may be brought to your room.  For patients admitted to the hospital, checkout time is 11:00 AM the day of discharge.                  SEE CHG INSTRUCTION SHEET    Please read over the following fact sheets that you were given: MRSA Information, coughing and deep breathing exercises, leg exercises, Blood Transfusion Fact Sheet

## 2012-05-10 ENCOUNTER — Encounter (HOSPITAL_COMMUNITY): Payer: Self-pay

## 2012-05-10 ENCOUNTER — Encounter (HOSPITAL_COMMUNITY)
Admission: RE | Admit: 2012-05-10 | Discharge: 2012-05-10 | Disposition: A | Discharge status: Home / Self Care | Payer: BC Managed Care – PPO | Source: Ambulatory Visit | Attending: Urology | Admitting: Urology

## 2012-05-10 LAB — BASIC METABOLIC PANEL
BUN: 13 mg/dL (ref 6–23)
CO2: 31 mEq/L (ref 19–32)
Calcium: 9.6 mg/dL (ref 8.4–10.5)
Creatinine, Ser: 0.97 mg/dL (ref 0.50–1.35)
Glucose, Bld: 63 mg/dL — ABNORMAL LOW (ref 70–99)

## 2012-05-10 LAB — CBC
HCT: 43.2 % (ref 39.0–52.0)
Hemoglobin: 15.2 g/dL (ref 13.0–17.0)
MCH: 30.6 pg (ref 26.0–34.0)
MCV: 86.9 fL (ref 78.0–100.0)
RBC: 4.97 MIL/uL (ref 4.22–5.81)

## 2012-05-10 NOTE — Progress Notes (Signed)
Glucose faxed via EPIC to Dr Isabel Caprice with note attached.

## 2012-05-10 NOTE — Progress Notes (Signed)
Called patient regarding glucose of 63 on preop labs.  Patient has eaten since he left preop appointment and feels fine.

## 2012-05-12 ENCOUNTER — Observation Stay (HOSPITAL_COMMUNITY)
Admission: RE | Admit: 2012-05-12 | Discharge: 2012-05-13 | Disposition: A | Discharge status: Home / Self Care | Payer: BC Managed Care – PPO | Source: Ambulatory Visit | Attending: Urology | Admitting: Urology

## 2012-05-12 ENCOUNTER — Encounter (HOSPITAL_COMMUNITY): Payer: Self-pay | Admitting: *Deleted

## 2012-05-12 ENCOUNTER — Encounter (HOSPITAL_COMMUNITY): Payer: Self-pay | Admitting: Anesthesiology

## 2012-05-12 ENCOUNTER — Ambulatory Visit (HOSPITAL_COMMUNITY): Payer: BC Managed Care – PPO | Admitting: Anesthesiology

## 2012-05-12 ENCOUNTER — Encounter (HOSPITAL_COMMUNITY)
Admission: RE | Disposition: A | Discharge status: Home / Self Care | Payer: Self-pay | Source: Ambulatory Visit | Attending: Urology

## 2012-05-12 DIAGNOSIS — Z79899 Other long term (current) drug therapy: Secondary | ICD-10-CM | POA: Insufficient documentation

## 2012-05-12 DIAGNOSIS — Z7982 Long term (current) use of aspirin: Secondary | ICD-10-CM | POA: Insufficient documentation

## 2012-05-12 DIAGNOSIS — Z01812 Encounter for preprocedural laboratory examination: Secondary | ICD-10-CM | POA: Insufficient documentation

## 2012-05-12 DIAGNOSIS — N476 Balanoposthitis: Secondary | ICD-10-CM | POA: Insufficient documentation

## 2012-05-12 DIAGNOSIS — C61 Malignant neoplasm of prostate: Principal | ICD-10-CM | POA: Insufficient documentation

## 2012-05-12 HISTORY — PX: CIRCUMCISION: SHX1350

## 2012-05-12 HISTORY — PX: ROBOT ASSISTED LAPAROSCOPIC RADICAL PROSTATECTOMY: SHX5141

## 2012-05-12 LAB — CBC
HCT: 38.3 % — ABNORMAL LOW (ref 39.0–52.0)
Hemoglobin: 13.5 g/dL (ref 13.0–17.0)
MCH: 30.5 pg (ref 26.0–34.0)
MCV: 86.7 fL (ref 78.0–100.0)
RBC: 4.42 MIL/uL (ref 4.22–5.81)

## 2012-05-12 SURGERY — ROBOTIC ASSISTED LAPAROSCOPIC RADICAL PROSTATECTOMY
Anesthesia: General | Wound class: Clean Contaminated

## 2012-05-12 MED ORDER — SODIUM CHLORIDE 0.9 % IR SOLN
Status: DC | PRN
  Administered 2012-05-12: 350 mL via INTRAVESICAL

## 2012-05-12 MED ORDER — LACTATED RINGERS IV SOLN
INTRAVENOUS | Status: DC | PRN
  Administered 2012-05-12: 09:00:00

## 2012-05-12 MED ORDER — CIPROFLOXACIN HCL 500 MG PO TABS: 500.0000 mg | ORAL_TABLET | Freq: Two times a day (BID) | ORAL | Status: DC

## 2012-05-12 MED ORDER — MORPHINE SULFATE 2 MG/ML IJ SOLN
2.0000 mg | INTRAMUSCULAR | Status: DC | PRN
  Administered 2012-05-12: 2 mg via INTRAVENOUS
  Filled 2012-05-12: qty 1

## 2012-05-12 MED ORDER — BUPIVACAINE HCL (PF) 0.25 % IJ SOLN
20.0000 mL | Freq: Once | INTRAMUSCULAR | Status: DC
Start: 2012-05-12 — End: 2012-05-12
  Filled 2012-05-12: qty 20

## 2012-05-12 MED ORDER — PROPOFOL 10 MG/ML IV BOLUS
INTRAVENOUS | Status: DC | PRN
  Administered 2012-05-12: 180 mg via INTRAVENOUS

## 2012-05-12 MED ORDER — MIDAZOLAM HCL 5 MG/5ML IJ SOLN
INTRAMUSCULAR | Status: DC | PRN
  Administered 2012-05-12: 2 mg via INTRAVENOUS

## 2012-05-12 MED ORDER — PHENYLEPHRINE HCL 10 MG/ML IJ SOLN
INTRAMUSCULAR | Status: DC | PRN
  Administered 2012-05-12 (×2): 40 ug via INTRAVENOUS

## 2012-05-12 MED ORDER — KCL IN DEXTROSE-NACL 10-5-0.45 MEQ/L-%-% IV SOLN
INTRAVENOUS | Status: DC
  Administered 2012-05-12 (×2): via INTRAVENOUS
  Filled 2012-05-12 (×5): qty 1000

## 2012-05-12 MED ORDER — PROMETHAZINE HCL 25 MG/ML IJ SOLN: 6.2500 mg | INTRAMUSCULAR | Status: DC | PRN

## 2012-05-12 MED ORDER — INDIGOTINDISULFONATE SODIUM 8 MG/ML IJ SOLN
INTRAMUSCULAR | Status: DC | PRN
  Administered 2012-05-12 (×2): 40 mg via INTRAVENOUS

## 2012-05-12 MED ORDER — FENTANYL CITRATE 0.05 MG/ML IJ SOLN
INTRAMUSCULAR | Status: DC | PRN
  Administered 2012-05-12 (×3): 50 ug via INTRAVENOUS
  Administered 2012-05-12: 100 ug via INTRAVENOUS

## 2012-05-12 MED ORDER — CEFAZOLIN SODIUM-DEXTROSE 2-3 GM-% IV SOLR
2.0000 g | INTRAVENOUS | Status: AC
  Administered 2012-05-12: 2 g via INTRAVENOUS

## 2012-05-12 MED ORDER — HYDROMORPHONE HCL PF 1 MG/ML IJ SOLN: 0.2500 mg | INTRAMUSCULAR | Status: DC | PRN

## 2012-05-12 MED ORDER — LACTATED RINGERS IV SOLN
INTRAVENOUS | Status: DC | PRN
  Administered 2012-05-12 (×2): via INTRAVENOUS

## 2012-05-12 MED ORDER — BUPIVACAINE-EPINEPHRINE PF 0.25-1:200000 % IJ SOLN
INTRAMUSCULAR | Status: AC
  Filled 2012-05-12: qty 30

## 2012-05-12 MED ORDER — ACETAMINOPHEN 10 MG/ML IV SOLN
1000.0000 mg | Freq: Four times a day (QID) | INTRAVENOUS | Status: AC
  Administered 2012-05-12 – 2012-05-13 (×4): 1000 mg via INTRAVENOUS
  Filled 2012-05-12 (×4): qty 100

## 2012-05-12 MED ORDER — ONDANSETRON HCL 4 MG/2ML IJ SOLN
4.0000 mg | INTRAMUSCULAR | Status: DC | PRN
  Administered 2012-05-12: 4 mg via INTRAVENOUS
  Filled 2012-05-12: qty 2

## 2012-05-12 MED ORDER — LIDOCAINE HCL (CARDIAC) 20 MG/ML IV SOLN
INTRAVENOUS | Status: DC | PRN
  Administered 2012-05-12: 50 mg via INTRAVENOUS

## 2012-05-12 MED ORDER — INDIGOTINDISULFONATE SODIUM 8 MG/ML IJ SOLN
INTRAMUSCULAR | Status: AC
  Filled 2012-05-12: qty 10

## 2012-05-12 MED ORDER — HYDROCODONE-ACETAMINOPHEN 5-325 MG PO TABS: 1.0000 | ORAL_TABLET | Freq: Four times a day (QID) | ORAL | Status: DC | PRN

## 2012-05-12 MED ORDER — SODIUM CHLORIDE 0.9 % IV BOLUS (SEPSIS)
1000.0000 mL | Freq: Once | INTRAVENOUS | Status: AC
  Administered 2012-05-12: 1000 mL via INTRAVENOUS

## 2012-05-12 MED ORDER — ACETAMINOPHEN 10 MG/ML IV SOLN
INTRAVENOUS | Status: AC
  Filled 2012-05-12: qty 100

## 2012-05-12 MED ORDER — ROCURONIUM BROMIDE 100 MG/10ML IV SOLN
INTRAVENOUS | Status: DC | PRN
  Administered 2012-05-12: 10 mg via INTRAVENOUS
  Administered 2012-05-12: 40 mg via INTRAVENOUS

## 2012-05-12 MED ORDER — HYDROCODONE-ACETAMINOPHEN 5-325 MG PO TABS: 1.0000 | ORAL_TABLET | ORAL | Status: DC | PRN

## 2012-05-12 MED ORDER — LACTATED RINGERS IV SOLN: INTRAVENOUS | Status: DC

## 2012-05-12 MED ORDER — BUPIVACAINE HCL (PF) 0.25 % IJ SOLN
INTRAMUSCULAR | Status: DC | PRN
  Administered 2012-05-12: 10 mL

## 2012-05-12 MED ORDER — ACETAMINOPHEN 10 MG/ML IV SOLN
INTRAVENOUS | Status: DC | PRN
  Administered 2012-05-12: 1000 mg via INTRAVENOUS

## 2012-05-12 MED ORDER — STERILE WATER FOR IRRIGATION IR SOLN
Status: DC | PRN
  Administered 2012-05-12: 3000 mL

## 2012-05-12 MED ORDER — BUPIVACAINE-EPINEPHRINE 0.25% -1:200000 IJ SOLN
INTRAMUSCULAR | Status: DC | PRN
  Administered 2012-05-12: 20 mL

## 2012-05-12 MED ORDER — ONDANSETRON HCL 4 MG/2ML IJ SOLN
INTRAMUSCULAR | Status: DC | PRN
  Administered 2012-05-12: 4 mg via INTRAVENOUS

## 2012-05-12 MED ORDER — OLOPATADINE HCL 0.1 % OP SOLN
1.0000 [drp] | Freq: Two times a day (BID) | OPHTHALMIC | Status: DC
  Filled 2012-05-12: qty 5

## 2012-05-12 MED ORDER — BACITRACIN ZINC 500 UNIT/GM EX OINT
TOPICAL_OINTMENT | CUTANEOUS | Status: AC
  Filled 2012-05-12: qty 15

## 2012-05-12 MED ORDER — CEFAZOLIN SODIUM-DEXTROSE 2-3 GM-% IV SOLR
INTRAVENOUS | Status: AC
  Filled 2012-05-12: qty 50

## 2012-05-12 MED ORDER — BACITRACIN ZINC 500 UNIT/GM EX OINT
TOPICAL_OINTMENT | CUTANEOUS | Status: DC | PRN
  Administered 2012-05-12: 1 via TOPICAL

## 2012-05-12 MED ORDER — HEPARIN SODIUM (PORCINE) 1000 UNIT/ML IJ SOLN
INTRAMUSCULAR | Status: AC
  Filled 2012-05-12: qty 1

## 2012-05-12 SURGICAL SUPPLY — 57 items
BANDAGE COBAN STERILE 2 (GAUZE/BANDAGES/DRESSINGS) ×1 IMPLANT
BLADE SURG 15 STRL LF DISP TIS (BLADE) ×1 IMPLANT
BLADE SURG 15 STRL SS (BLADE)
CANISTER SUCTION 2500CC (MISCELLANEOUS) ×3 IMPLANT
CATH FOLEY 2WAY SLVR  5CC 20FR (CATHETERS)
CATH FOLEY 2WAY SLVR 5CC 20FR (CATHETERS) ×1 IMPLANT
CATH ROBINSON RED A/P 8FR (CATHETERS) ×3 IMPLANT
CHLORAPREP W/TINT 26ML (MISCELLANEOUS) ×3 IMPLANT
CLIP LIGATING HEM O LOK PURPLE (MISCELLANEOUS) ×4 IMPLANT
CLOTH BEACON ORANGE TIMEOUT ST (SAFETY) ×3 IMPLANT
CORD HIGH FREQUENCY UNIPOLAR (ELECTROSURGICAL) ×3 IMPLANT
COVER SURGICAL LIGHT HANDLE (MISCELLANEOUS) ×6 IMPLANT
COVER TIP SHEARS 8 DVNC (MISCELLANEOUS) ×2 IMPLANT
COVER TIP SHEARS 8MM DA VINCI (MISCELLANEOUS) ×1
CUTTER ECHEON FLEX ENDO 45 340 (ENDOMECHANICALS) ×3 IMPLANT
DECANTER SPIKE VIAL GLASS SM (MISCELLANEOUS) ×3 IMPLANT
DRAPE LAPAROTOMY T 102X78X121 (DRAPES) ×1 IMPLANT
DRAPE SURG IRRIG POUCH 19X23 (DRAPES) ×3 IMPLANT
DRAPE UTILITY 15X26 (DRAPE) ×3 IMPLANT
DRSG TEGADERM 6X8 (GAUZE/BANDAGES/DRESSINGS) ×9 IMPLANT
ELECT NDL TIP 2.8 STRL (NEEDLE) ×1 IMPLANT
ELECT NEEDLE TIP 2.8 STRL (NEEDLE) IMPLANT
ELECT REM PT RETURN 9FT ADLT (ELECTROSURGICAL) ×3
ELECTRODE REM PT RTRN 9FT ADLT (ELECTROSURGICAL) ×2 IMPLANT
GAUZE SPONGE 4X4 16PLY XRAY LF (GAUZE/BANDAGES/DRESSINGS) ×1 IMPLANT
GAUZE XEROFORM 1X8 LF (GAUZE/BANDAGES/DRESSINGS) ×1 IMPLANT
GLOVE BIO SURGEON STRL SZ 6.5 (GLOVE) ×6 IMPLANT
GLOVE BIOGEL M STRL SZ7.5 (GLOVE) ×3 IMPLANT
GOWN PREVENTION PLUS XLARGE (GOWN DISPOSABLE) ×3 IMPLANT
GOWN STRL NON-REIN LRG LVL3 (GOWN DISPOSABLE) ×3 IMPLANT
GOWN STRL REIN XL XLG (GOWN DISPOSABLE) ×3 IMPLANT
HOLDER FOLEY CATH W/STRAP (MISCELLANEOUS) ×3 IMPLANT
IV LACTATED RINGERS 1000ML (IV SOLUTION) ×1 IMPLANT
KIT ACCESSORY DA VINCI DISP (KITS) ×1
KIT ACCESSORY DVNC DISP (KITS) ×2 IMPLANT
KIT BASIN OR (CUSTOM PROCEDURE TRAY) ×3 IMPLANT
NDL SAFETY ECLIPSE 18X1.5 (NEEDLE) ×2 IMPLANT
NEEDLE HYPO 18GX1.5 SHARP (NEEDLE) ×3
NEEDLE HYPO 25X1 1.5 SAFETY (NEEDLE) IMPLANT
NS IRRIG 1000ML POUR BTL (IV SOLUTION) ×1 IMPLANT
PACK BASIC VI WITH GOWN DISP (CUSTOM PROCEDURE TRAY) ×1 IMPLANT
PACK ROBOT UROLOGY CUSTOM (CUSTOM PROCEDURE TRAY) ×3 IMPLANT
PENCIL BUTTON HOLSTER BLD 10FT (ELECTRODE) ×1 IMPLANT
RELOAD GREEN ECHELON 45 (STAPLE) ×3 IMPLANT
SEALER TISSUE G2 CVD JAW 45CM (ENDOMECHANICALS) ×2 IMPLANT
SET TUBE IRRIG SUCTION NO TIP (IRRIGATION / IRRIGATOR) ×3 IMPLANT
SOLUTION ELECTROLUBE (MISCELLANEOUS) ×3 IMPLANT
SPONGE GAUZE 4X4 12PLY (GAUZE/BANDAGES/DRESSINGS) ×1 IMPLANT
SUT VIC AB 2-0 SH 27 (SUTURE) ×3
SUT VIC AB 2-0 SH 27X BRD (SUTURE) ×2 IMPLANT
SUT VIC AB 4-0 PS2 27 (SUTURE) ×5 IMPLANT
SUT VIC AB 5-0 RB1 27 (SUTURE) ×2 IMPLANT
SUT VICRYL 0 UR6 27IN ABS (SUTURE) ×3 IMPLANT
SYR 27GX1/2 1ML LL SAFETY (SYRINGE) ×3 IMPLANT
SYR CONTROL 10ML LL (SYRINGE) IMPLANT
TOWEL OR NON WOVEN STRL DISP B (DISPOSABLE) ×3 IMPLANT
WATER STERILE IRR 1500ML POUR (IV SOLUTION) ×2 IMPLANT

## 2012-05-12 NOTE — Anesthesia Postprocedure Evaluation (Signed)
  Anesthesia Post-op Note  Patient: John Lane  Procedure(s) Performed: Procedure(s) (LRB): ROBOTIC ASSISTED LAPAROSCOPIC RADICAL PROSTATECTOMY (N/A) LYMPHADENECTOMY (Bilateral) CIRCUMCISION ADULT (N/A)  Patient Location: PACU  Anesthesia Type: General  Level of Consciousness: awake and alert   Airway and Oxygen Therapy: Patient Spontanous Breathing  Post-op Pain: mild  Post-op Assessment: Post-op Vital signs reviewed, Patient's Cardiovascular Status Stable, Respiratory Function Stable, Patent Airway and No signs of Nausea or vomiting  Post-op Vital Signs: stable  Complications: No apparent anesthesia complications

## 2012-05-12 NOTE — Transfer of Care (Signed)
Immediate Anesthesia Transfer of Care Note  Patient: John Lane  Procedure(s) Performed: Procedure(s) (LRB): ROBOTIC ASSISTED LAPAROSCOPIC RADICAL PROSTATECTOMY (N/A) LYMPHADENECTOMY (Bilateral) CIRCUMCISION ADULT (N/A)  Patient Location: PACU  Anesthesia Type: General  Level of Consciousness: sedated, patient cooperative and responds to stimulaton  Airway & Oxygen Therapy: Patient Spontanous Breathing and Patient connected to face mask oxgen  Post-op Assessment: Report given to PACU RN and Post -op Vital signs reviewed and stable  Post vital signs: Reviewed and stable  Complications: No apparent anesthesia complications

## 2012-05-12 NOTE — Progress Notes (Signed)
Patient was ambulating this am and got to doorway and started "feeling woozy". Patient was immediately taken to chair and bp was taken and bp was 76/44 and HR 85, oxygen saturation 96% room air and temp 98.4.  Patient able to answer questions appropriately, alert and oriented, just felt "tired and a little dizzy". Patient was moved to bed and after five minutes, blood pressure came up to 102/67.  On call practitioner was called and Jetta Lout gave orders to obtain a stat CBC and for patient to remain in bed until am.  Will continue to closely monitor patient. Setzer, Don Broach

## 2012-05-12 NOTE — Op Note (Signed)
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 Postoperative diagnosis: Same  Procedure: Robotic-assisted laparoscopic radical retropubic prostatectomy. Circumcision Surgeon: Valetta Fuller, MD  Asst.: Pecola Leisure, PA Anesthesia: Gen. Endotracheal  Indications: Patient was diagnosed with clinical stage TIc Adenocarcinoma the prostate. He underwent extensive consultation with regard to treatment options. The patient decided on a surgical approach. He appeared to understand the distinct advantages as well as the disadvantages of this procedure. The patient has performed a mechanical bowel prep. He has had placement of PAS compression boots and has received perioperative antibiotics. The patient's preoperative PSA was 4.3. Ultrasound revealed a 15 g prostate.  The patient also requested a circumcision. Clinically he did not have significant phimosis. The patient had reported intermittent episodes of mild balanitis. He understood the advantages and disadvantages circumcision and again requested that this be performed under the same setting. This was his requests rather than a recommendation. Technique and findings:The patient was brought to the operating room and had successful induction of general endotracheal anesthesia.the patient was placed in a low lithotomy position with careful padding of all extremities. He was secured to the operative table and placed in the steep Trendelenburg position. He was prepped and draped in usual manner. A Foley catheter was placed sterilely on the field. Camera port site was chosen 18 cm above the pubic symphysis just to the left of the umbilicus. A standard open Hassan technique was utilized. A 12 mm trocar was placed without difficulty. The camera was then inserted and no abnormalities were noted within the pelvis. The trochars were placed with direct visual guidance. This included 3 8mm robotic trochars and a 12 mm and 5 mm  assist ports. Once all the ports were placed the robot was docked. The bladder was filled and the space of Retzius was developed with electrocautery dissection as well as blunt dissection. Superficial fat off the endopelvic fascia and bladder neck was removed with electrocautery scissors. The endopelvic fascia was then incised bilaterally from base to apex. Levator musculature was swept off the apex of the prostate isolating the dorsal venous complex which was then stapled with the ETS stapling device. The anterior bladder neck was identified with the aid of the Foley balloon. This was then transected down to the Foley catheter with electrocautery scissors. The Foley catheter was then retracted anteriorly. Indigo carmine was given and we appeared to be well away from the ureteral orifices. The posterior bladder neck was then transected and the dissection carried down to the adnexal structures. The seminal vesicles and vas deferens on both sides were then individually dissected free and retracted anteriorly. The posterior plane between the rectum and prostate was then established primarily with blunt dissection.  Attention was then turned towards nerve sparing. The patient was felt to be a candidate for bilateral nerve sparing. Superficial fascia along the anterior lateral aspect of the prostate was incised bilaterally. This tissue was then swept laterally until we were able to establish a groove between the neurovascular tissue and the posterior lateral aspect on the prostate bilaterally. This groove was then extended from the apex back to the base of the prostate. With the prostate retracted anteriorly the vascular pedicles of the prostate were taken with the Enseal device. The Foley catheter was then reinserted and the anterior urethra was transected. The posterior urethra was then transected as were some rectourethralis fibers. The prostate was then removed from the pelvis. The pelvis was then copiously  irrigated. Rectal insufflation was performed and  there was no evidence of rectal injury.   Attention was then turned towards reconstruction. The bladder neck did not require any reconstruction. The bladder neck and posterior urethra were reapproximated at the 6:00 position utilizing a 2-0 Vicryl suture. The rest of the anastomosis was done with a double-armed 3-0 Monocryl suture in a 360 degree manner. Additional indigo carmine was given. A new catheter was placed and bladder irrigation revealed no evidence of leakage. A Blake drain was placed through one of the robotic trochars and positioned in the retropubic space above the anastomosis. This was then secured to the skin with a nylon suture. The prostate was placed in the Endopouch retrieval bag. The 12 mm trocar site was closed with a Vicryl suture with the aid of a suture passer. Our other trochars were taken out with direct visual guidance without evidence of any bleeding. The camera port incision was extended slightly to allow for removal of the specimen and then closed with a running Vicryl suture. All port sites were infiltrated with Marcaine and then closed with surgical clips. The patient was then taken to recovery room having had no obvious complications or problems. Sponge and needle counts were correct.   Attention was then turned towards circumcision. A circumferential incision was made behind the glans penis. The foreskin was retracted and a second circumferential incision was made in the mucosal collar. The sleeve of redundant foreskin was removed. No other significant pathology was appreciated. Skin edges were reapproximated with a series of interrupted 4-0 Vicryl suture. A very light pressure dressing was applied. No obvious complications occurred.

## 2012-05-12 NOTE — Progress Notes (Signed)
Day of Surgery Subjective: Patient reports pain control good.  No complaints  Objective: Vital signs in last 24 hours: Temp:  [96.5 F (35.8 C)-97.5 F (36.4 C)] 97.3 F (36.3 C) (10/09 1613) Pulse Rate:  [76-96] 96  (10/09 1613) Resp:  [9-20] 20  (10/09 1613) BP: (109-135)/(63-92) 120/80 mmHg (10/09 1613) SpO2:  [98 %-100 %] 100 % (10/09 1613) Weight:  [66.815 kg (147 lb 4.8 oz)] 66.815 kg (147 lb 4.8 oz) (10/09 1248)  Intake/Output from previous day:   Intake/Output this shift: Total I/O In: 3450 [I.V.:2450; IV Piggyback:1000] Out: 1755 [Urine:1680; Drains:25; Blood:50]  Physical Exam:  General:alert, cooperative and no distress Cardiovascular: RRR Lungs: BS clear and unlabored GI: soft Incisions: dressings clean Urine: yellow Extremities: SCDs in place  Lab Results:  Basename 05/10/12 0930  HGB 15.2  HCT 43.2   BMET  Basename 05/10/12 0930  NA 137  K 4.9  CL 100  CO2 31  GLUCOSE 63*  BUN 13  CREATININE 0.97  CALCIUM 9.6   No results found for this basename: LABPT:3,INR:3 in the last 72 hours No results found for this basename: LABURIN:1 in the last 72 hours Results for orders placed during the hospital encounter of 05/10/12  SURGICAL PCR SCREEN     Status: Normal   Collection Time   05/10/12  8:50 AM      Component Value Range Status Comment   MRSA, PCR NEGATIVE  NEGATIVE Final    Staphylococcus aureus NEGATIVE  NEGATIVE Final     Studies/Results: No results found.  Assessment/Plan: Day of Surgery, Procedure(s) (LRB): ROBOTIC ASSISTED LAPAROSCOPIC RADICAL PROSTATECTOMY (N/A) CIRCUMCISION ADULT (N/A)  Doing well Ambulate, Incentive spirometry DVT prophylaxis Clears   LOS: 0 days   YARBROUGH,Marquiz Sotelo G. 05/12/2012, 4:35 PM

## 2012-05-12 NOTE — Anesthesia Preprocedure Evaluation (Signed)
Anesthesia Evaluation  Patient identified by MRN, date of birth, ID band Patient awake    Reviewed: Allergy & Precautions, H&P , NPO status , Patient's Chart, lab work & pertinent test results  Airway Mallampati: II TM Distance: >3 FB Neck ROM: Full    Dental No notable dental hx.    Pulmonary former smoker,  breath sounds clear to auscultation  Pulmonary exam normal       Cardiovascular negative cardio ROS  Rhythm:Regular Rate:Normal     Neuro/Psych PSYCHIATRIC DISORDERS negative neurological ROS     GI/Hepatic negative GI ROS, Neg liver ROS,   Endo/Other  negative endocrine ROS  Renal/GU negative Renal ROS  negative genitourinary   Musculoskeletal negative musculoskeletal ROS (+)   Abdominal   Peds negative pediatric ROS (+)  Hematology negative hematology ROS (+)   Anesthesia Other Findings   Reproductive/Obstetrics negative OB ROS                           Anesthesia Physical Anesthesia Plan  ASA: II  Anesthesia Plan: General   Post-op Pain Management:    Induction: Intravenous  Airway Management Planned: Oral ETT  Additional Equipment:   Intra-op Plan:   Post-operative Plan: Extubation in OR  Informed Consent: I have reviewed the patients History and Physical, chart, labs and discussed the procedure including the risks, benefits and alternatives for the proposed anesthesia with the patient or authorized representative who has indicated his/her understanding and acceptance.   Dental advisory given  Plan Discussed with: CRNA  Anesthesia Plan Comments:         Anesthesia Quick Evaluation

## 2012-05-12 NOTE — H&P (Signed)
Here for cancer conference/ robotic consult for T1C prostate cancer. Pre-op AUA 4/ [facility] 16/25 ( uses Cialis with good results)   History of Present Illness        Prostate cancer history from [place]:  F/u prostate cancer. His PSA January 2013 was 5.17. He had a PSA four years ago that was 1.6. He has no FH of PCa. PSA repeat Mar 2013 was 4.27 with 11%free.   Other labs January 2013: BUN 14, cr 0.9, LFTs and alkaline phosphatase normal.   The patient endorses urinary frequency and urgency when he drinks 2-3 cups of coffee. He voids with a good stream without straining or intermittent flow. He has had no dysuria or hematuria. He has trouble getting and maintaining an erection.urinalysis negative    May 2013: PSA 5.17  T1c 15 gram prostate (PSAD = 0.35) Gleason 3+4=7, right medial apex 30% Atypia right mid  Intermediate risk disease [facility]: 78% OC, 15% ECE, 2% SV, 2.3% LN; 5 yr PFP 96% surgery, 83% brachy Partin: 66% OC, 29% ECE, 4% SV, 1% LN  June 2013 - CMP normal; CT A/P, bone scan negative for metastatic disease  Interval Hx He returns and has had no issues from the biopsy.   Past Medical History Problems  1. History of  Allergies V15.09  Surgical History Problems  1. History of  Appendectomy 2. History of  Tonsillectomy  Current Meds 1. Aspirin 81 MG Oral Tablet; Therapy: (Recorded:18Mar2013) to 2. Cialis 5 MG Oral Tablet; Therapy: 08Mar2013 to 3. Omnaris 50 MCG/ACT Nasal Suspension; Therapy: 28Feb2013 to 4. Rhinocort AERO; Therapy: (Recorded:18Mar2013) to 5. ZyrTEC Allergy TABS; Therapy: (Recorded:18Mar2013) to  Allergies Medication  1. No Known Drug Allergies  Family History Problems  1. Family history of  Family Health Status Number Of Children 3 sons 2. Paternal history of  Heart Disease V17.49 3. Maternal history of  Hypertension V17.49  Social History Problems  1. Alcohol Use 1-2 a week 2. Caffeine Use 3 a day 3. Former Smoker V15.82 4.  Marital History - Currently Married 5. Occupation: Pensions consultant 6. History of  Tobacco Use 305.1 smoked x 10-12 years  Review of Systems Genitourinary, constitutional, skin, eye, otolaryngeal, hematologic/lymphatic, cardiovascular, pulmonary, endocrine, musculoskeletal, gastrointestinal, neurological and psychiatric system(s) were reviewed and pertinent findings if present are noted.  Genitourinary: erectile dysfunction.  Constitutional: night sweats.    Vitals  Blood Pressure: 147 / 97 Temperature: 97.8 F Heart Rate: 79  Physical Exam Constitutional: Well nourished and well developed . No acute distress.  ENT:. The ears and nose are normal in appearance.  Neck: The appearance of the neck is normal and no neck mass is present.  Pulmonary: No respiratory distress and normal respiratory rhythm and effort.  Cardiovascular: Heart rate and rhythm are normal . No peripheral edema.  Abdomen: The abdomen is soft and nontender. No masses are palpated. No CVA tenderness. No hernias are palpable. No hepatosplenomegaly noted.  Genitourinary: Examination of the penis demonstrates no discharge, no masses, no lesions and a normal meatus. The scrotum is without lesions. The right epididymis is palpably normal and non-tender. The left epididymis is palpably normal and non-tender. The right testis is non-tender and without masses. The left testis is non-tender and without masses.  Skin: Normal skin turgor, no visible rash and no visible skin lesions.  Neuro/Psych:. Mood and affect are appropriate.    Results/Data Selected Results  PSA 02Aug2013 03:27PM Jerilee Field SPECIMEN TYPE: BLOOD  Test Name Result Flag Reference PSA 4.55  ng/mL H <=4.00 Result repeated and verified. Test Methodology: ECLIA PSA (Electrochemiluminescence Immunoassay)  CT-ABD/PELVIS W/W/O CONTRAST 28Jun2013 12:00AM Jerilee Field  Test Name Result Flag Reference ** RADIOLOGY REPORT BY [facility]  ** ORIGINAL APPROVED BY: Dellia Cloud, M.D. ON: 01/30/2012 13:09:29   *RADIOLOGY REPORT*  Clinical Data: Prostate cancer. Increased PSA.  CT ABDOMEN AND PELVIS WITHOUT AND WITH CONTRAST  Technique: Multidetector CT imaging of the abdomen and pelvis was performed without contrast material in one or both body regions, followed by contrast material(s) and further sections in one or both body regions.  Contrast: 100 ml Isovue 300  Comparison: None.  Findings: The right kidney lower pole 2 mm nonobstructive calculus is visible on the noncontrast CT images.  A 3.6 x 2.9 cm mass in the lateral segment left hepatic lobe is faintly hypodense precontrast, demonstrates peripheral nodular discontinuous enhancement on portal venous phase images, and demonstrates delayed diffuse enhancement.  A highly subtle 0.8 cm hypodensity anteriorly in the lateral segment left hepatic lobe is nonspecific and may simply represent heterogeneity related to the early contrast phase on initial postcontrast images. Similar faint heterogeneous enhancement noted in the spleen.  The pancreas and adrenal glands appear normal. Retroaortic left renal vein is present. No renal mass observed.  No pathologic retroperitoneal or porta hepatis adenopathy is identified.  No pathologic pelvic adenopathy is identified.  Borderline wall thickening and sigmoid colon is not shown on the delayed images and accordingly probably is due to peristalsis. Urinary bladder appears unremarkable. No obvious lesion of the seminal vesicles is observed. No asymmetry or significant enhancement abnormality in the prostate gland is observed, although CT is not sensitive for early prostate cancer in the gland.  No compelling findings of osseous metastatic disease. Pericecal lymph nodes are within normal limits for size.  No significant filling defect is observed in the collecting systems, ureters, or urinary  bladder.  IMPRESSION:  1. No specific pelvic mass or adenopathy is identified. No findings of osseous metastatic disease. 2. 3.6 cm mass in the lateral segment left hepatic lobe has enhancement characteristics typical for benign hepatic hemangioma. 3. Subtle 0.8 cm hypodensity anteriorly in the lateral segment left hepatic lobe is nonspecific and probably represents heterogeneity related to early contrast phase on initial postcontrast images. 4. Right kidney lower pole 2 mm nonobstructive calculus.  COMPREHENSIVE METABOLIC PANEL 27Jun2013 10:49AM Jerilee Field SPECIMEN TYPE: BLOOD  Test Name Result Flag Reference GLUCOSE 70 mg/dL  16-10 BUN 14 mg/dL  9-60 CREATININE 4.54 mg/dL  0.98-1.19 SODIUM 147 mEq/L  135-145 POTASSIUM 3.9 mEq/L  3.5-5.3 CHLORIDE 102 mEq/L  96-112 CO2 27 mEq/L  19-32 CALCIUM 9.6 mg/dL  8.2-95.6 TOTAL PROTEIN 6.9 g/dL  2.1-3.0 ALBUMIN 4.5 g/dL  8.6-5.7 AST/SGOT 18 U/L  0-37 ALT/SGPT 16 U/L  0-53 ALKALINE PHOSPHATASE 50 U/L  39-117 BILIRUBIN, TOTAL 0.6 mg/dL  8.4-6.9 Est GFR, African American >89 mL/min   Est GFR, NonAfrican American 79 mL/min   The estimated GFR is a calculation valid for adults (39 to 51 years old) that uses the CKD-EPI algorithm to adjust for age and sex. It is not to be used for children, pregnant women, hospitalized patients, patients on dialysis, or with rapidly changing kidney function. According to the NKDEP, eGFR >89 is normal, 60-89 shows mild impairment, 30-59 shows moderate impairment, 15-29 shows severe impairment and <15 is ESRD.  Assessment Assessed  1. 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 Follow-up Schedule Surgery Office  Follow-up  Done: 06Aug2013  Discussion/Summary  The patient was counseled about the natural history of prostate cancer and the standard treatment options that are available for prostate cancer. It was explained to him how  his age and life expectancy, clinical stage, Gleason score, and PSA affect his prognosis, the decision to proceed with additional staging studies, as well as how that information influences recommended treatment strategies. We discussed the roles for active surveillance, radiation therapy, surgical therapy, androgen deprivation, as well as ablative therapy options for the treatment of prostate cancer as appropriate to his individual cancer situation. We discussed the risks and benefits of these options with regard to their impact on cancer control and also in terms of potential adverse events, complications, and impact on quiality of life particularly related to urinary, bowel, and sexual function. The patient was encouraged to ask questions throughout the discussion today and all questions were answered to his stated satisfaction. In addition, the patient was provided with and/or directed to appropriate resources and literature for further education about prostate cancer and treatment options.   We discussed surgical therapy for prostate cancer including the different available surgical approaches. We discussed, in detail, the risks and expectations of surgery with regard to cancer control, urinary control, and erectile function as well as the expected postoperative recovery process. The risks, potential complications/adverse events of radical prostatectomy as well as alternative options were explained to the patient.   We discussed surgical therapy for prostate cancer including the different available surgical approaches. We discussed, in detail, the risks and expectations of surgery with regard to cancer control, urinary control, and erectile function as well as the expected postoperative recovery process. Additional risks of surgery including but not limited to bleeding, infection, hernia formation, nerve damage, lymphocele formation, bowel/rectal injury potentially necessitating colostomy, damage to the  urinary tract resulting in urine leakage, urethral stricture, and the cardiopulmonary risks such as myocardial infarction, stroke, death, venothromboembolism, etc. were explained. The risk of open surgical conversion for robotic/laparoscopic prostatectomy was also discussed.   50 minutes were spent in face to face consultation with patient today.   Patient has also requested an elective circ which we have agreed to perform after the prostate surgery if all goes well.

## 2012-05-13 ENCOUNTER — Encounter (HOSPITAL_COMMUNITY): Payer: Self-pay | Admitting: Urology

## 2012-05-13 LAB — BASIC METABOLIC PANEL
BUN: 5 mg/dL — ABNORMAL LOW (ref 6–23)
CO2: 27 mEq/L (ref 19–32)
Chloride: 104 mEq/L (ref 96–112)
Creatinine, Ser: 0.84 mg/dL (ref 0.50–1.35)

## 2012-05-13 LAB — HEMOGLOBIN AND HEMATOCRIT, BLOOD
HCT: 37.5 % — ABNORMAL LOW (ref 39.0–52.0)
Hemoglobin: 13.6 g/dL (ref 13.0–17.0)

## 2012-05-13 NOTE — Progress Notes (Signed)
1 Day Post-Op Subjective: Patient reports no significant discomfort other than some mild penile soreness this morning. The patient was lightheaded with an attempted ambulation earlier today and was noted to have significant orthostatic hypotension. Hemoglobin however is within normal limits and minimally changed from preop at 13.6.  Objective: Vital signs in last 24 hours: Temp:  [96.5 F (35.8 C)-98.6 F (37 C)] 98.5 F (36.9 C) (10/10 0607) Pulse Rate:  [67-96] 67  (10/10 0607) Resp:  [9-20] 20  (10/10 0607) BP: (76-124)/(44-80) 91/60 mmHg (10/10 0607) SpO2:  [96 %-100 %] 98 % (10/10 0607) Weight:  [66.815 kg (147 lb 4.8 oz)] 66.815 kg (147 lb 4.8 oz) (10/09 1248)  Intake/Output from previous day: 10/09 0701 - 10/10 0700 In: 5854.2 [P.O.:120; I.V.:4404.2; IV Piggyback:1300] Out: 4796 [Urine:4705; Drains:41; Blood:50] Intake/Output this shift:    Physical Exam:  General:alert Cardiovascular: Regular rate and rhythm Lungs: Normal effort  GI: Soft. Incisions: Without active bleeding Urine: Clear Extremities: No edema or tenderness Genitourinary: Circumcision incision healing nicely without active bleeding minimal edema.  Lab Results:  Basename 05/13/12 0501 05/12/12 1945 05/10/12 0930  HGB 13.6 13.5 15.2  HCT 37.5* 38.3* 43.2   BMET  Basename 05/13/12 0501 05/10/12 0930  NA 138 137  K 3.4* 4.9  CL 104 100  CO2 27 31  GLUCOSE 108* 63*  BUN 5* 13  CREATININE 0.84 0.97  CALCIUM 8.3* 9.6   No results found for this basename: LABPT:3,INR:3 in the last 72 hours No results found for this basename: LABURIN:1 in the last 72 hours Results for orders placed during the hospital encounter of 05/10/12  SURGICAL PCR SCREEN     Status: Normal   Collection Time   05/10/12  8:50 AM      Component Value Range Status Comment   MRSA, PCR NEGATIVE  NEGATIVE Final    Staphylococcus aureus NEGATIVE  NEGATIVE Final     Studies/Results: No results found.  Assessment/Plan: 1 Day  Post-Op, Procedure(s) (LRB): ROBOTIC ASSISTED LAPAROSCOPIC RADICAL PROSTATECTOMY (N/A) CIRCUMCISION ADULT (N/A)  Patient is currently doing well. Jackson-Pratt drain to be removed. The patient will undergo another attempted ambulation. He should be able to be discharged later today.   LOS: 1 day   Jasmine Mcbeth S 05/13/2012, 7:57 AM

## 2012-05-13 NOTE — Discharge Summary (Signed)
  Date of admission: 05/12/2012  Date of discharge: 05/13/2012  Admission diagnosis: Prostate Cancer  Discharge diagnosis: Prostate Cancer  History and Physical: For full details, please see admission history and physical. Briefly, John Lane is a 51 y.o. gentleman with localized prostate cancer.  After discussing management/treatment options, he elected to proceed with surgical treatment.  Hospital Course: Aleck Locklin was taken to the operating room on 05/12/2012 and underwent a robotic assisted laparoscopic radical prostatectomy. He tolerated this procedure well and without complications. Postoperatively, he was able to be transferred to a regular hospital room following recovery from anesthesia.  He was able to begin ambulating the night of surgery. He had orthostatic hypotension in the evening of the operative day and early in POD #1.  His H/H remained stable.  This resolved with IVF replacement.  He had excellent urine output with appropriately minimal output from his pelvic drain and his pelvic drain was removed on POD #1.  He was transitioned to oral pain medication, tolerated a clear liquid diet, and had met all discharge criteria and was able to be discharged home later on POD#1.  Laboratory values:  Basename 05/13/12 0501 05/12/12 1945  HGB 13.6 13.5  HCT 37.5* 38.3*    Disposition: Home  Discharge instruction: He was instructed to be ambulatory but to refrain from heavy lifting, strenuous activity, or driving. He was instructed on urethral catheter care.  Discharge medications:     Medication List     As of 05/13/2012  1:25 PM    START taking these medications         ciprofloxacin 500 MG tablet   Commonly known as: CIPRO   Take 1 tablet (500 mg total) by mouth 2 (two) times daily. Start day prior to office visit for foley removal      HYDROcodone-acetaminophen 5-325 MG per tablet   Commonly known as: NORCO/VICODIN   Take 1-2 tablets by mouth every 6 (six) hours as  needed for pain.      CONTINUE taking these medications         acetaminophen 500 MG tablet   Commonly known as: TYLENOL   Take 1 tablet (500 mg total) by mouth every 6 (six) hours as needed.      budesonide 32 MCG/ACT nasal spray   Commonly known as: RHINOCORT AQUA   Place 1 spray into the nose daily.      PATANOL 0.1 % ophthalmic solution   Generic drug: olopatadine      ZYRTEC-D ALLERGY & CONGESTION 5-120 MG per tablet   Generic drug: cetirizine-pseudoephedrine      STOP taking these medications         aspirin 81 MG tablet      cholecalciferol 1000 UNITS tablet   Commonly known as: VITAMIN D      Ibuprofen 200 MG Caps      tadalafil 5 MG tablet   Commonly known as: CIALIS      VITAMIN E PO          Where to get your medications    These are the prescriptions that you need to pick up.   You may get these medications from any pharmacy.         ciprofloxacin 500 MG tablet   HYDROcodone-acetaminophen 5-325 MG per tablet            Followup: He will followup in 1 week for catheter removal and to discuss his surgical pathology results.

## 2012-08-02 ENCOUNTER — Telehealth: Payer: Self-pay | Admitting: *Deleted

## 2012-08-02 MED ORDER — HYDROCORTISONE 0.5 % EX CREA: TOPICAL_CREAM | Freq: Two times a day (BID) | CUTANEOUS | Status: DC

## 2012-08-02 NOTE — Telephone Encounter (Signed)
Pt request Rx for Hydrocortisone cream to [facility] [not px prior], has rash on face on both sides of nose believed to be d/t "cleaning of eyeglasses w/Windex & not properly drying before wearing". Rash is red & flat [no drainage, purulent, raised surface]. Pt is wanting to use Flexsp[employer] dollars on Insurance and needs a Rx to do so/SLS Please advise.

## 2012-08-02 NOTE — Telephone Encounter (Signed)
Rx to pharmacy, patient informed/SLS

## 2012-08-02 NOTE — Telephone Encounter (Signed)
So he wants otc? If so ok for bid prn #[phone] gm

## 2012-08-16 ENCOUNTER — Telehealth: Payer: Self-pay | Admitting: Internal Medicine

## 2012-08-16 DIAGNOSIS — Z0000 Encounter for general adult medical examination without abnormal findings: Secondary | ICD-10-CM

## 2012-08-16 DIAGNOSIS — N529 Male erectile dysfunction, unspecified: Secondary | ICD-10-CM

## 2012-08-16 DIAGNOSIS — R972 Elevated prostate specific antigen [PSA]: Secondary | ICD-10-CM

## 2012-08-16 DIAGNOSIS — C61 Malignant neoplasm of prostate: Secondary | ICD-10-CM

## 2012-08-16 NOTE — Telephone Encounter (Signed)
CPE lab orders entered w/requested/SLS

## 2012-08-16 NOTE — Telephone Encounter (Signed)
Patient has cpe on 09/09/12. He will be coming in a week prior for labs and would like to have his PSA and testosterone levels checked also.

## 2012-08-16 NOTE — Telephone Encounter (Signed)
Patient informed/SLS  

## 2012-08-26 ENCOUNTER — Encounter: Payer: Self-pay | Admitting: Internal Medicine

## 2012-09-09 ENCOUNTER — Encounter: Payer: BC Managed Care – PPO | Admitting: Family Medicine

## 2012-10-07 ENCOUNTER — Other Ambulatory Visit: Payer: Self-pay

## 2012-10-07 DIAGNOSIS — C61 Malignant neoplasm of prostate: Secondary | ICD-10-CM

## 2012-10-07 DIAGNOSIS — R972 Elevated prostate specific antigen [PSA]: Secondary | ICD-10-CM

## 2012-10-07 DIAGNOSIS — N529 Male erectile dysfunction, unspecified: Secondary | ICD-10-CM

## 2012-10-07 DIAGNOSIS — Z0000 Encounter for general adult medical examination without abnormal findings: Secondary | ICD-10-CM

## 2012-10-08 LAB — CBC WITH DIFFERENTIAL/PLATELET
Basophils Absolute: 0 10*3/uL (ref 0.0–0.1)
Basophils Relative: 0 % (ref 0–1)
HCT: 45.6 % (ref 39.0–52.0)
Lymphocytes Relative: 44 % (ref 12–46)
Monocytes Absolute: 0.4 10*3/uL (ref 0.1–1.0)
Neutro Abs: 2.4 10*3/uL (ref 1.7–7.7)
Neutrophils Relative %: 44 % (ref 43–77)
Platelets: 204 10*3/uL (ref 150–400)
RDW: 13.6 % (ref 11.5–15.5)
WBC: 5.3 10*3/uL (ref 4.0–10.5)

## 2012-10-08 LAB — URINALYSIS, ROUTINE W REFLEX MICROSCOPIC
Bilirubin Urine: NEGATIVE
Glucose, UA: NEGATIVE mg/dL
Leukocytes, UA: NEGATIVE
Specific Gravity, Urine: 1.005 (ref 1.005–1.030)
Urobilinogen, UA: 0.2 mg/dL (ref 0.0–1.0)

## 2012-10-08 LAB — LIPID PANEL
Total CHOL/HDL Ratio: 3.8 Ratio
VLDL: 25 mg/dL (ref 0–40)

## 2012-10-08 LAB — URINALYSIS, MICROSCOPIC ONLY
Casts: NONE SEEN
Crystals: NONE SEEN
Squamous Epithelial / LPF: NONE SEEN

## 2012-10-08 LAB — TSH: TSH: 1.697 u[IU]/mL (ref 0.350–4.500)

## 2012-10-08 LAB — TESTOSTERONE: Testosterone: 399 ng/dL (ref 300–890)

## 2012-10-08 LAB — HEPATIC FUNCTION PANEL
Bilirubin, Direct: 0.1 mg/dL (ref 0.0–0.3)
Total Bilirubin: 0.5 mg/dL (ref 0.3–1.2)

## 2012-10-08 LAB — BASIC METABOLIC PANEL
CO2: 28 mEq/L (ref 19–32)
Glucose, Bld: 86 mg/dL (ref 70–99)
Potassium: 4.3 mEq/L (ref 3.5–5.3)
Sodium: 135 mEq/L (ref 135–145)

## 2012-10-11 ENCOUNTER — Encounter: Payer: Self-pay | Admitting: Family Medicine

## 2012-10-11 ENCOUNTER — Ambulatory Visit (INDEPENDENT_AMBULATORY_CARE_PROVIDER_SITE_OTHER): Payer: BC Managed Care – PPO | Admitting: Family Medicine

## 2012-10-11 VITALS — BP 148/88 | HR 77 | Temp 98.1°F | Ht 65.0 in | Wt 149.1 lb

## 2012-10-11 DIAGNOSIS — Z0000 Encounter for general adult medical examination without abnormal findings: Secondary | ICD-10-CM

## 2012-10-11 DIAGNOSIS — N529 Male erectile dysfunction, unspecified: Secondary | ICD-10-CM

## 2012-10-11 MED ORDER — SILDENAFIL CITRATE 100 MG PO TABS: 50.0000 mg | ORAL_TABLET | Freq: Every day | ORAL | Status: DC | PRN

## 2012-10-11 NOTE — Patient Instructions (Addendum)
Call with bp numbers in 1 month  Preventive Care for Adults, Male A healthy lifestyle and preventive care can promote health and wellness. Preventive health guidelines for men include the following key practices:  A routine yearly physical is a good way to check with your caregiver about your health and preventative screening. It is a chance to share any concerns and updates on your health, and to receive a thorough exam.  Visit your dentist for a routine exam and preventative care every 6 months. Brush your teeth twice a day and floss once a day. Good oral hygiene prevents tooth decay and gum disease.  The frequency of eye exams is based on your age, health, family medical history, use of contact lenses, and other factors. Follow your caregiver's recommendations for frequency of eye exams.  Eat a healthy diet. Foods like vegetables, fruits, whole grains, low-fat dairy products, and lean protein foods contain the nutrients you need without too many calories. Decrease your intake of foods high in solid fats, added sugars, and salt. Eat the right amount of calories for you.Get information about a proper diet from your caregiver, if necessary.  Regular physical exercise is one of the most important things you can do for your health. Most adults should get at least 150 minutes of moderate-intensity exercise (any activity that increases your heart rate and causes you to sweat) each week. In addition, most adults need muscle-strengthening exercises on 2 or more days a week.  Maintain a healthy weight. The body mass index (BMI) is a screening tool to identify possible weight problems. It provides an estimate of body fat based on height and weight. Your caregiver can help determine your BMI, and can help you achieve or maintain a healthy weight.For adults 20 years and older:  A BMI below 18.5 is considered underweight.  A BMI of 18.5 to 24.9 is normal.  A BMI of 25 to 29.9 is considered  overweight.  A BMI of 30 and above is considered obese.  Maintain normal blood lipids and cholesterol levels by exercising and minimizing your intake of saturated fat. Eat a balanced diet with plenty of fruit and vegetables. Blood tests for lipids and cholesterol should begin at age 57 and be repeated every 5 years. If your lipid or cholesterol levels are high, you are over 50, or you are a high risk for heart disease, you may need your cholesterol levels checked more frequently.Ongoing high lipid and cholesterol levels should be treated with medicines if diet and exercise are not effective.  If you smoke, find out from your caregiver how to quit. If you do not use tobacco, do not start.  If you choose to drink alcohol, do not exceed 2 drinks per day. One drink is considered to be 12 ounces (355 mL) of beer, 5 ounces (148 mL) of wine, or 1.5 ounces (44 mL) of liquor.  Avoid use of street drugs. Do not share needles with anyone. Ask for help if you need support or instructions about stopping the use of drugs.  High blood pressure causes heart disease and increases the risk of stroke. Your blood pressure should be checked at least every 1 to 2 years. Ongoing high blood pressure should be treated with medicines, if weight loss and exercise are not effective.  If you are 75 to 52 years old, ask your caregiver if you should take aspirin to prevent heart disease.  Diabetes screening involves taking a blood sample to check your fasting blood sugar level.  This should be done once every 3 years, after age 74, if you are within normal weight and without risk factors for diabetes. Testing should be considered at a younger age or be carried out more frequently if you are overweight and have at least 1 risk factor for diabetes.  Colorectal cancer can be detected and often prevented. Most routine colorectal cancer screening begins at the age of 20 and continues through age 32. However, your caregiver may  recommend screening at an earlier age if you have risk factors for colon cancer. On a yearly basis, your caregiver may provide home test kits to check for hidden blood in the stool. Use of a small camera at the end of a tube, to directly examine the colon (sigmoidoscopy or colonoscopy), can detect the earliest forms of colorectal cancer. Talk to your caregiver about this at age 32, when routine screening begins. Direct examination of the colon should be repeated every 5 to 10 years through age 91, unless early forms of pre-cancerous polyps or small growths are found.  Hepatitis C blood testing is recommended for all people born from 18 through 1965 and any individual with known risks for hepatitis C.  Practice safe sex. Use condoms and avoid high-risk sexual practices to reduce the spread of sexually transmitted infections (STIs). STIs include gonorrhea, chlamydia, syphilis, trichomonas, herpes, HPV, and human immunodeficiency virus (HIV). Herpes, HIV, and HPV are viral illnesses that have no cure. They can result in disability, cancer, and death.  A one-time screening for abdominal aortic aneurysm (AAA) and surgical repair of large AAAs by sound wave imaging (ultrasonography) is recommended for ages 93 to 70 years who are current or former smokers.  Healthy men should no longer receive prostate-specific antigen (PSA) blood tests as part of routine cancer screening. Consult with your caregiver about prostate cancer screening.  Testicular cancer screening is not recommended for adult males who have no symptoms. Screening includes self-exam, caregiver exam, and other screening tests. Consult with your caregiver about any symptoms you have or any concerns you have about testicular cancer.  Use sunscreen with skin protection factor (SPF) of 30 or more. Apply sunscreen liberally and repeatedly throughout the day. You should seek shade when your shadow is shorter than you. Protect yourself by wearing long  sleeves, pants, a wide-brimmed hat, and sunglasses year round, whenever you are outdoors.  Once a month, do a whole body skin exam, using a mirror to look at the skin on your back. Notify your caregiver of new moles, moles that have irregular borders, moles that are larger than a pencil eraser, or moles that have changed in shape or color.  Stay current with required immunizations.  Influenza. You need a dose every fall (or winter). The composition of the flu vaccine changes each year, so being vaccinated once is not enough.  Pneumococcal polysaccharide. You need 1 to 2 doses if you smoke cigarettes or if you have certain chronic medical conditions. You need 1 dose at age 8 (or older) if you have never been vaccinated.  Tetanus, diphtheria, pertussis (Tdap, Td). Get 1 dose of Tdap vaccine if you are younger than age 63 years, are over 48 and have contact with an infant, are a Research scientist (physical sciences), or simply want to be protected from whooping cough. After that, you need a Td booster dose every 10 years. Consult your caregiver if you have not had at least 3 tetanus and diphtheria-containing shots sometime in your life or have a deep or  dirty wound.  HPV. This vaccine is recommended for males 13 through 52 years of age. This vaccine may be given to men 22 through 52 years of age who have not completed the 3 dose series. It is recommended for men through age 63 who have sex with men or whose immune system is weakened because of HIV infection, other illness, or medications. The vaccine is given in 3 doses over 6 months.  Measles, mumps, rubella (MMR). You need at least 1 dose of MMR if you were born in 1957 or later. You may also need a 2nd dose.  Meningococcal. If you are age 64 to 62 years and a Orthoptist living in a residence hall, or have one of several medical conditions, you need to get vaccinated against meningococcal disease. You may also need additional booster doses.  Zoster  (shingles). If you are age 45 years or older, you should get this vaccine.  Varicella (chickenpox). If you have never had chickenpox or you were vaccinated but received only 1 dose, talk to your caregiver to find out if you need this vaccine.  Hepatitis A. You need this vaccine if you have a specific risk factor for hepatitis A virus infection, or you simply wish to be protected from this disease. The vaccine is usually given as 2 doses, 6 to 18 months apart.  Hepatitis B. You need this vaccine if you have a specific risk factor for hepatitis B virus infection or you simply wish to be protected from this disease. The vaccine is given in 3 doses, usually over 6 months. Preventative Service / Frequency Ages 38 to 25  Blood pressure check.** / Every 1 to 2 years.  Lipid and cholesterol check.** / Every 5 years beginning at age 68.  Hepatitis C blood test.** / For any individual with known risks for hepatitis C.  Skin self-exam. / Monthly.  Influenza immunization.** / Every year.  Pneumococcal polysaccharide immunization.** / 1 to 2 doses if you smoke cigarettes or if you have certain chronic medical conditions.  Tetanus, diphtheria, pertussis (Tdap,Td) immunization. / A one-time dose of Tdap vaccine. After that, you need a Td booster dose every 10 years.  HPV immunization. / 3 doses over 6 months, if 26 and younger.  Measles, mumps, rubella (MMR) immunization. / You need at least 1 dose of MMR if you were born in 1957 or later. You may also need a 2nd dose.  Meningococcal immunization. / 1 dose if you are age 80 to 67 years and a Orthoptist living in a residence hall, or have one of several medical conditions, you need to get vaccinated against meningococcal disease. You may also need additional booster doses.  Varicella immunization.** / Consult your caregiver.  Hepatitis A immunization.** / Consult your caregiver. 2 doses, 6 to 18 months apart.  Hepatitis B  immunization.** / Consult your caregiver. 3 doses usually over 6 months. Ages 53 to 41  Blood pressure check.** / Every 1 to 2 years.  Lipid and cholesterol check.** / Every 5 years beginning at age 19.  Fecal occult blood test (FOBT) of stool. / Every year beginning at age 26 and continuing until age 28. You may not have to do this test if you get colonoscopy every 10 years.  Flexible sigmoidoscopy** or colonoscopy.** / Every 5 years for a flexible sigmoidoscopy or every 10 years for a colonoscopy beginning at age 4 and continuing until age 69.  Hepatitis C blood test.** / For all  people born from [birth year] through 1965 and any individual with known risks for hepatitis C.  Skin self-exam. / Monthly.  Influenza immunization.** / Every year.  Pneumococcal polysaccharide immunization.** / 1 to 2 doses if you smoke cigarettes or if you have certain chronic medical conditions.  Tetanus, diphtheria, pertussis (Tdap/Td) immunization.** / A one-time dose of Tdap vaccine. After that, you need a Td booster dose every 10 years.  Measles, mumps, rubella (MMR) immunization. / You need at least 1 dose of MMR if you were born in 1957 or later. You may also need a 2nd dose.  Varicella immunization.**/ Consult your caregiver.  Meningococcal immunization.** / Consult your caregiver.  Hepatitis A immunization.** / Consult your caregiver. 2 doses, 6 to 18 months apart.  Hepatitis B immunization.** / Consult your caregiver. 3 doses, usually over 6 months. Ages 39 and over  Blood pressure check.** / Every 1 to 2 years.  Lipid and cholesterol check.**/ Every 5 years beginning at age 36.  Fecal occult blood test (FOBT) of stool. / Every year beginning at age 81 and continuing until age 62. You may not have to do this test if you get colonoscopy every 10 years.  Flexible sigmoidoscopy** or colonoscopy.** / Every 5 years for a flexible sigmoidoscopy or every 10 years for a colonoscopy beginning at age 33  and continuing until age 56.  Hepatitis C blood test.** / For all people born from [birth year] through 1965 and any individual with known risks for hepatitis C.  Abdominal aortic aneurysm (AAA) screening.** / A one-time screening for ages 1 to 88 years who are current or former smokers.  Skin self-exam. / Monthly.  Influenza immunization.** / Every year.  Pneumococcal polysaccharide immunization.** / 1 dose at age 22 (or older) if you have never been vaccinated.  Tetanus, diphtheria, pertussis (Tdap, Td) immunization. / A one-time dose of Tdap vaccine if you are over 65 and have contact with an infant, are a Research scientist (physical sciences), or simply want to be protected from whooping cough. After that, you need a Td booster dose every 10 years.  Varicella immunization. ** / Consult your caregiver.  Meningococcal immunization.** / Consult your caregiver.  Hepatitis A immunization. ** / Consult your caregiver. 2 doses, 6 to 18 months apart.  Hepatitis B immunization.** / Check with your caregiver. 3 doses, usually over 6 months. **Family history and personal history of risk and conditions may change your caregiver's recommendations. Document Released: 09/16/2001 Document Revised: 10/13/2011 Document Reviewed: 12/16/2010 [facility] Patient Information 2013 [place], [place].

## 2012-10-13 MED ORDER — NAPROXEN SODIUM 220 MG PO TABS: 220.0000 mg | ORAL_TABLET | Freq: Two times a day (BID) | ORAL | Status: DC

## 2012-10-13 MED ORDER — IBUPROFEN 200 MG PO TABS: 200.0000 mg | ORAL_TABLET | Freq: Three times a day (TID) | ORAL | Status: DC | PRN

## 2012-10-13 MED ORDER — ACETAMINOPHEN 325 MG PO TABS: 650.0000 mg | ORAL_TABLET | Freq: Four times a day (QID) | ORAL | Status: DC | PRN

## 2012-10-13 MED ORDER — CETIRIZINE-PSEUDOEPHEDRINE ER 5-120 MG PO TB12: 1.0000 | ORAL_TABLET | Freq: Two times a day (BID) | ORAL | Status: DC

## 2012-10-13 NOTE — Assessment & Plan Note (Signed)
Given rx for Viagra to try prn

## 2012-10-13 NOTE — Assessment & Plan Note (Signed)
Doing well, encouraged heart healthy diet and regular exercise should continue. Regular exercise. Labs reviewed with patient

## 2012-10-13 NOTE — Progress Notes (Signed)
Patient ID: PAARTH CROPPER, male   DOB: 12-19-1960, 52 y.o.   MRN: [id number] ALEXIOS KEOWN [id number] [birth year] 10/13/2012      Progress Note New Patient  Subjective  Chief Complaint  Chief Complaint  Patient presents with  . Annual Exam    physical    HPI  Patient is a 52 yo male in today for annual exam. He has been struggling with a minor head cold and has been using Sudafed when necessary. No fevers or chills. No headaches or ear pain. Has had some minor nasal congestion. No chest pain no palpitations, chest congestion, shortness of breath, GI or GU complaints. He is exercising daily and trying to manage a heart healthy diet. Past Medical History  Diagnosis Date  . Allergy   . 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 H/O hematuria     2003    Past Surgical History  Procedure Laterality Date  . Appendectomy    . Tonsillectomy    . Robot assisted laparoscopic radical prostatectomy  05/12/2012    Procedure: ROBOTIC ASSISTED LAPAROSCOPIC RADICAL PROSTATECTOMY;  Surgeon: Valetta Fuller, MD;  Location: [facility];  Service: Urology;  Laterality: N/A;  . Circumcision  05/12/2012    Procedure: CIRCUMCISION ADULT;  Surgeon: Valetta Fuller, MD;  Location: [facility];  Service: Urology;  Laterality: N/A;    Family History  Problem Relation Age of Onset  . Coronary artery disease Brother   . Diabetes Father   . Hypertension Father   . Heart disease Father   . Hypertension Mother     History   Social History  . Marital Status: Married    Spouse Name: N/A    Number of Children: N/A  . Years of Education: N/A   Occupational History  . Not on file.   Social History Main Topics  . Smoking status: Former Smoker -- 0.50 packs/day for 10 years    Quit date: 08/05/1991  . Smokeless tobacco: Never Used  . Alcohol Use: 1.0 oz/week    2 drink(s) per week     Comment: 1-2 weekly  . Drug Use: No  . Sexually Active: Yes   Other Topics Concern  . Not on file   Social  History Narrative   Married   Former smoker   Quit over 18 years ago-smoked for 10 years 1/2 ppd   3 children    Current Outpatient Prescriptions on File Prior to Visit  Medication Sig Dispense Refill  . budesonide (RHINOCORT AQUA) 32 MCG/ACT nasal spray Place 1 spray into the nose daily.  8.6 g  5  . olopatadine (PATANOL) 0.1 % ophthalmic solution       . hydrocortisone cream 0.5 % Apply topically 2 (two) times daily.  90 g  0   No current facility-administered medications on file prior to visit.    No Known Allergies  Review of Systems  Review of Systems  Constitutional: Negative for fever, chills and malaise/fatigue.  HENT: Negative for hearing loss, nosebleeds and congestion.   Eyes: Negative for discharge.  Respiratory: Negative for cough, sputum production, shortness of breath and wheezing.   Cardiovascular: Negative for chest pain, palpitations and leg swelling.  Gastrointestinal: Negative for heartburn, nausea, vomiting, abdominal pain, diarrhea, constipation and blood in stool.  Genitourinary: Negative for dysuria, urgency, frequency and hematuria.  Musculoskeletal: Negative for myalgias, back pain and falls.  Skin: Negative for rash.  Neurological: Negative for dizziness, tremors, sensory change,  focal weakness, loss of consciousness, weakness and headaches.  Endo/Heme/Allergies: Negative for polydipsia. Does not bruise/bleed easily.  Psychiatric/Behavioral: Negative for depression and suicidal ideas. The patient is not nervous/anxious and does not have insomnia.     Objective  BP 148/88  Pulse 77  Temp(Src) 98.1 F (36.7 C) (Oral)  Ht 5\' 5"  (1.651 m)  Wt 149 lb 1.9 oz (67.64 kg)  BMI 24.81 kg/m2  SpO2 98%  Physical Exam  Physical Exam  Constitutional: He is oriented to person, place, and time and well-developed, well-nourished, and in no distress. No distress.  HENT:  Head: Normocephalic and atraumatic.  Eyes: Conjunctivae are normal.  Neck: Neck  supple. No thyromegaly present.  Cardiovascular: Normal rate, regular rhythm and normal heart sounds.   No murmur heard. Pulmonary/Chest: Effort normal and breath sounds normal. No respiratory distress.  Abdominal: He exhibits no distension and no mass. There is no tenderness.  Musculoskeletal: He exhibits no edema.  Neurological: He is alert and oriented to person, place, and time.  Skin: Skin is warm.  Psychiatric: Memory, affect and judgment normal.       Assessment & Plan  Annual physical exam Doing well, encouraged heart healthy diet and regular exercise should continue. Regular exercise. Labs reviewed with patient  ERECTILE DYSFUNCTION, ORGANIC Given rx for Viagra to try prn

## 2012-10-15 NOTE — Progress Notes (Signed)
Quick Note:  Patient Informed and voiced understanding ______ 

## 2012-10-26 ENCOUNTER — Other Ambulatory Visit: Payer: Self-pay

## 2012-10-26 MED ORDER — IBUPROFEN 200 MG PO TABS: 200.0000 mg | ORAL_TABLET | Freq: Three times a day (TID) | ORAL | Status: DC | PRN

## 2012-11-04 ENCOUNTER — Telehealth: Payer: Self-pay

## 2012-11-04 MED ORDER — NAPROXEN SODIUM 220 MG PO TABS: 220.0000 mg | ORAL_TABLET | Freq: Two times a day (BID) | ORAL | Status: DC

## 2012-11-04 NOTE — Telephone Encounter (Signed)
Pt would like aleve sent to the pharmacy

## 2012-11-17 ENCOUNTER — Other Ambulatory Visit: Payer: Self-pay | Admitting: *Deleted

## 2012-11-17 MED ORDER — OLOPATADINE HCL 0.1 % OP SOLN: 1.0000 [drp] | Freq: Two times a day (BID) | OPHTHALMIC | Status: DC

## 2012-11-17 NOTE — Telephone Encounter (Signed)
Rx request to pharmacy requested/SLS

## 2012-11-26 ENCOUNTER — Other Ambulatory Visit: Payer: Self-pay | Admitting: Internal Medicine

## 2012-11-26 NOTE — Telephone Encounter (Signed)
Rx request to pharmacy/SLS  

## 2013-05-13 ENCOUNTER — Other Ambulatory Visit: Payer: Self-pay | Admitting: *Deleted

## 2013-05-13 MED ORDER — CETIRIZINE-PSEUDOEPHEDRINE ER 5-120 MG PO TB12: 1.0000 | ORAL_TABLET | Freq: Every day | ORAL | Status: DC | PRN

## 2013-05-13 NOTE — Telephone Encounter (Signed)
Rx request to pharmacy/SLS  

## 2013-06-02 ENCOUNTER — Ambulatory Visit (INDEPENDENT_AMBULATORY_CARE_PROVIDER_SITE_OTHER): Payer: BC Managed Care – PPO | Admitting: Physician Assistant

## 2013-06-02 ENCOUNTER — Encounter: Payer: Self-pay | Admitting: Physician Assistant

## 2013-06-02 VITALS — BP 140/100 | HR 87 | Temp 98.0°F | Resp 16 | Ht 65.0 in | Wt 148.5 lb

## 2013-06-02 DIAGNOSIS — H9202 Otalgia, left ear: Secondary | ICD-10-CM

## 2013-06-02 DIAGNOSIS — H9209 Otalgia, unspecified ear: Secondary | ICD-10-CM

## 2013-06-02 MED ORDER — ANTIPYRINE-BENZOCAINE 5.4-1.4 % OT SOLN: 3.0000 [drp] | Freq: Four times a day (QID) | OTIC | Status: DC | PRN

## 2013-06-02 MED ORDER — FLUTICASONE PROPIONATE 50 MCG/ACT NA SUSP: 2.0000 | Freq: Every day | NASAL | Status: DC

## 2013-06-02 NOTE — Assessment & Plan Note (Signed)
No evidence of infection or TMJ pain.  Rx Auralgan otic drops.  Rx Flonase.  Daily zyrtec.  Topical Aspercreme around TMJ joint.  Call or return if symptoms do not improve.

## 2013-06-02 NOTE — Progress Notes (Signed)
Patient ID: John Lane, male   DOB: 12-Jun-1961, 52 y.o.   MRN: [id number]  Patient presents to clinic today c/o intermittent left ear pain over the past 2 weeks.  States he first noticed the pain the morning after he went camping and slept on the wet ground.  Pain is sharp and is mostly present with yawning or sneezing.  Denies ear pressure, drainage, tinnitus, decrease in hearing.  Denies recent URI or respiratory symptoms.  Denies pain with chewing or history of TMJ.  Denies fever, chills, malaise.  Denies right ear pain or discomfort.      Past Medical History  Diagnosis Date  . Allergy   . 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 H/O hematuria     2003    Current Outpatient Prescriptions on File Prior to Visit  Medication Sig Dispense Refill  . acetaminophen (TYLENOL) 325 MG tablet Take 2 tablets (650 mg total) by mouth every 6 (six) hours as needed for pain.  100 tablet  11  . budesonide (RHINOCORT AQUA) 32 MCG/ACT nasal spray Place 1 spray into the nose daily.  8.6 g  5  . cetirizine-pseudoephedrine (ZYRTEC-D ALLERGY & CONGESTION) 5-120 MG per tablet Take 1 tablet by mouth daily as needed for allergies.  30 tablet  2  . hydrocortisone cream 0.5 % Apply topically 2 (two) times daily.  90 g  0  . ibuprofen (ADVIL,MOTRIN) 200 MG tablet Take 1 tablet (200 mg total) by mouth 3 (three) times daily as needed for pain. Advil Liqui-gel  320 tablet  1  . sildenafil (VIAGRA) 100 MG tablet Take 0.5-1 tablets (50-100 mg total) by mouth daily as needed for erectile dysfunction.  9 tablet  3   No current facility-administered medications on file prior to visit.    No Known Allergies  Family History  Problem Relation Age of Onset  . Coronary artery disease Brother   . Diabetes Father   . Hypertension Father   . Heart disease Father   . Hypertension Mother     History   Social History  . Marital Status: Married    Spouse Name: N/A    Number of Children: N/A  . Years  of Education: N/A   Social History Main Topics  . Smoking status: Former Smoker -- 0.50 packs/day for 10 years    Quit date: 08/05/1991  . Smokeless tobacco: Never Used  . Alcohol Use: 1.0 oz/week    2 drink(s) per week     Comment: 1-2 weekly  . Drug Use: No  . Sexual Activity: Yes   Other Topics Concern  . None   Social History Narrative   Married   Former smoker   Quit over 18 years ago-smoked for 10 years 1/2 ppd   3 children   ROS See HPI.  All other ROS are negative.  Filed Vitals:   06/02/13 1621  BP: 140/100  Pulse: 87  Temp: 98 F (36.7 C)  Resp: 16    Physical Exam  Vitals reviewed. Constitutional: He is oriented to person, place, and time and well-developed, well-nourished, and in no distress.  HENT:  Head: Normocephalic and atraumatic.  Right Ear: External ear normal.  Left Ear: External ear normal.  Nose: Nose normal.  Mouth/Throat: Oropharynx is clear and moist. No oropharyngeal exudate.  Tympanic membranes within normal limits bilaterally.  No evidence of bacterial or fungal infection.  No tenderness with pressing on tragus of right ear.  No welling of external canals.  No popping or clicking of TMJ with jaw movements.  Eyes: Conjunctivae are normal.  Neck: Neck supple.  Cardiovascular: Normal rate, regular rhythm and normal heart sounds.   Pulmonary/Chest: Effort normal and breath sounds normal. No respiratory distress. He has no wheezes. He has no rales. He exhibits no tenderness.  Lymphadenopathy:    He has no cervical adenopathy.  Neurological: He is alert and oriented to person, place, and time. No cranial nerve deficit.  Skin: Skin is warm and dry. No rash noted.  Psychiatric: Affect normal.   Assessment/Plan: Ear pain No evidence of infection or TMJ pain.  Rx Auralgan otic drops.  Rx Flonase.  Daily zyrtec.  Topical Aspercreme around TMJ joint.  Call or return if symptoms do not improve.

## 2013-06-02 NOTE — Patient Instructions (Signed)
Apply topical aspercreme around ear and top of jaw line.  Auralgan drops as needed to left ear for pain.  Ibuprofen.  Daily zyrtec and Flonase.  Call or return if symptoms do not improve.

## 2013-06-10 ENCOUNTER — Telehealth: Payer: Self-pay | Admitting: *Deleted

## 2013-06-10 DIAGNOSIS — H9202 Otalgia, left ear: Secondary | ICD-10-CM

## 2013-06-10 MED ORDER — AMOXICILLIN 500 MG PO CAPS: 500.0000 mg | ORAL_CAPSULE | Freq: Two times a day (BID) | ORAL | Status: DC

## 2013-06-10 NOTE — Telephone Encounter (Signed)
Patient states that when he was in office last week for possible ear infection that it was discussed if issue not better in one week to call office to have ABX ordered; reports still having issue and request ABX to [facility]/SLS Please Advise.

## 2013-06-10 NOTE — Telephone Encounter (Signed)
Patient informed, understood & agreed/SLS  

## 2013-06-10 NOTE — Telephone Encounter (Signed)
Will Rx. Amoxicillin 500 mg BID x 10 days.  Return to clinic if symptoms are not improving.

## 2013-06-28 ENCOUNTER — Other Ambulatory Visit: Payer: Self-pay | Admitting: Internal Medicine

## 2013-06-28 NOTE — Telephone Encounter (Signed)
Medication has been Rx'd by Dr. Rodena Medin in the past.  I approved medication refill request.  Patient needs to select a new PCP as I have only seen him once for an acute issue.  He had his annual physical with Dr. Abner Greenspan.

## 2013-06-28 NOTE — Telephone Encounter (Signed)
Received request for Aspirin Rx. I do not see med on current med list.  Is it ok to send Rx?

## 2013-07-06 ENCOUNTER — Telehealth: Payer: Self-pay | Admitting: Internal Medicine

## 2013-07-06 NOTE — Telephone Encounter (Signed)
Please Advise

## 2013-07-06 NOTE — Telephone Encounter (Signed)
OK to give rx for Pepto Bismol use as directed disp #240 bottle with 5 rf

## 2013-07-06 NOTE — Telephone Encounter (Signed)
Patient is requesting a new prescription for pepto bismol 24oz. He states that his flex spending will pay for it if the provider calls in a prescription. [facility] [address]. Patient would like a callback on cell when completed.

## 2013-07-07 MED ORDER — BISMUTH SUBSALICYLATE 262 MG/15ML PO SUSP: 30.0000 mL | ORAL | Status: DC

## 2013-07-12 ENCOUNTER — Telehealth: Payer: Self-pay | Admitting: Physician Assistant

## 2013-07-12 DIAGNOSIS — H9202 Otalgia, left ear: Secondary | ICD-10-CM

## 2013-07-12 MED ORDER — FLUTICASONE PROPIONATE 50 MCG/ACT NA SUSP: 2.0000 | Freq: Every day | NASAL | Status: DC

## 2013-07-12 NOTE — Telephone Encounter (Signed)
Request written RX of fluticasone prop spray qty 16, 30 day supply  Request written so he can you flex spending acct card

## 2013-07-12 NOTE — Telephone Encounter (Signed)
Rx printed and forwarded to Provider for signature. 

## 2013-07-13 ENCOUNTER — Other Ambulatory Visit: Payer: Self-pay | Admitting: Physician Assistant

## 2013-07-13 DIAGNOSIS — J309 Allergic rhinitis, unspecified: Secondary | ICD-10-CM

## 2013-07-13 MED ORDER — TRIAMCINOLONE ACETONIDE 55 MCG/ACT NA AERO: 2.0000 | INHALATION_SPRAY | Freq: Every day | NASAL | Status: DC

## 2013-07-13 NOTE — Telephone Encounter (Signed)
Rx signed, placed in envelope and is at front desk, ready for pickup.

## 2013-07-13 NOTE — Telephone Encounter (Signed)
Pt informed and voiced understanding

## 2013-08-19 ENCOUNTER — Telehealth: Payer: Self-pay | Admitting: Internal Medicine

## 2013-08-19 DIAGNOSIS — Z0000 Encounter for general adult medical examination without abnormal findings: Secondary | ICD-10-CM

## 2013-08-19 DIAGNOSIS — R972 Elevated prostate specific antigen [PSA]: Secondary | ICD-10-CM

## 2013-08-19 NOTE — Telephone Encounter (Signed)
Patient is requesting to have CPE labs done and a PSA the week of 11-07-13.  Please advise which labs and diagnosis

## 2013-08-19 NOTE — Telephone Encounter (Signed)
lab order week of 11/07/13  cpe labs. Patient is also requesting to have PSA checked. [place] [facility].

## 2013-08-20 NOTE — Telephone Encounter (Signed)
Medicare PSA, cbc, tsh, lipd, hepatic, renal for annual exam

## 2013-08-22 NOTE — Telephone Encounter (Signed)
Lab order placed.

## 2013-10-20 ENCOUNTER — Telehealth: Payer: Self-pay | Admitting: Physician Assistant

## 2013-10-20 NOTE — Telephone Encounter (Signed)
Patient is requesting a new prescription of flonase 3pk to be sent to [facility]. He says that he would like to try this and wanted his flex spending to pay for it. He would like a callback regarding this.

## 2013-10-21 MED ORDER — FLUTICASONE PROPIONATE 50 MCG/ACT NA SUSP: 2.0000 | Freq: Every day | NASAL | Status: DC

## 2013-10-21 NOTE — Telephone Encounter (Signed)
Rx request to pharmacy/SLS  

## 2013-10-21 NOTE — Telephone Encounter (Signed)
So he can have Nasacort or Flonase either one is fine with me just clarify which one he wants and we can send in

## 2013-10-21 NOTE — Telephone Encounter (Signed)
Diagnoses    Allergic rhinitis - Primary   477.9    Medications Ordered This Encounter       Disp Refills Start End    triamcinolone (NASACORT ALLERGY 24HR) 55 MCG/ACT AERO nasal inhaler 1 Inhaler 12 07/13/2013     Place 2 sprays into the nose daily. - Nasal     Discontinued Medications      fluticasone (FLONASE) 50 MCG/ACT nasal spray  Reason for Discontinue Change in therapy    Einar Pheasant seen patient in December for Acute visit [still has Dr. Elizebeth Koller as PCP], medication requested was D/C at visit/SLS Please Advise.

## 2013-11-15 LAB — LIPID PANEL
CHOL/HDL RATIO: 3.9 ratio
Cholesterol: 178 mg/dL (ref 0–200)
HDL: 46 mg/dL (ref >39)
LDL Cholesterol: 115 mg/dL — ABNORMAL HIGH (ref 0–99)
TRIGLYCERIDES: 87 mg/dL (ref <150)
VLDL: 17 mg/dL (ref 0–40)

## 2013-11-15 LAB — CBC
HCT: 44.5 % (ref 39.0–52.0)
HEMOGLOBIN: 15.9 g/dL (ref 13.0–17.0)
MCH: 30.4 pg (ref 26.0–34.0)
MCHC: 35.7 g/dL (ref 30.0–36.0)
MCV: 85.1 fL (ref 78.0–100.0)
Platelets: 209 10*3/uL (ref 150–400)
RBC: 5.23 MIL/uL (ref 4.22–5.81)
RDW: 13.1 % (ref 11.5–15.5)
WBC: 5.5 10*3/uL (ref 4.0–10.5)

## 2013-11-15 LAB — HEPATIC FUNCTION PANEL
ALBUMIN: 4.2 g/dL (ref 3.5–5.2)
ALT: 15 U/L (ref 0–53)
AST: 19 U/L (ref 0–37)
Alkaline Phosphatase: 49 U/L (ref 39–117)
BILIRUBIN DIRECT: 0.1 mg/dL (ref 0.0–0.3)
Indirect Bilirubin: 0.7 mg/dL (ref 0.2–1.2)
Total Bilirubin: 0.8 mg/dL (ref 0.2–1.2)
Total Protein: 6.8 g/dL (ref 6.0–8.3)

## 2013-11-15 LAB — RENAL FUNCTION PANEL
ALBUMIN: 4.2 g/dL (ref 3.5–5.2)
BUN: 10 mg/dL (ref 6–23)
CALCIUM: 9.3 mg/dL (ref 8.4–10.5)
CO2: 28 mEq/L (ref 19–32)
Chloride: 101 mEq/L (ref 96–112)
Creat: 0.85 mg/dL (ref 0.50–1.35)
GLUCOSE: 85 mg/dL (ref 70–99)
Phosphorus: 3.3 mg/dL (ref 2.3–4.6)
Potassium: 4.1 mEq/L (ref 3.5–5.3)
Sodium: 136 mEq/L (ref 135–145)

## 2013-11-16 LAB — TSH: TSH: 1.809 u[IU]/mL (ref 0.350–4.500)

## 2013-11-16 LAB — PSA, MEDICARE

## 2013-11-17 ENCOUNTER — Encounter: Payer: BC Managed Care – PPO | Admitting: Family Medicine

## 2013-11-17 ENCOUNTER — Other Ambulatory Visit: Payer: Self-pay | Admitting: Internal Medicine

## 2013-11-17 NOTE — Telephone Encounter (Signed)
Rx request to pharmacy/SLS  

## 2013-11-21 ENCOUNTER — Other Ambulatory Visit: Payer: Self-pay | Admitting: Family Medicine

## 2013-12-12 ENCOUNTER — Telehealth: Payer: Self-pay | Admitting: Internal Medicine

## 2013-12-12 ENCOUNTER — Telehealth: Payer: Self-pay

## 2013-12-12 MED ORDER — CETIRIZINE-PSEUDOEPHEDRINE ER 5-120 MG PO TB12: 1.0000 | ORAL_TABLET | Freq: Every day | ORAL | Status: DC | PRN

## 2013-12-12 MED ORDER — ZYRTEC-D ALLERGY & CONGESTION 5-120 MG PO TB12: 1.0000 | ORAL_TABLET | Freq: Every day | ORAL | Status: DC | PRN

## 2013-12-12 NOTE — Telephone Encounter (Signed)
Pt states that the RX needs to be for quantity 24 and needs to be name brand  RX resent

## 2013-12-12 NOTE — Telephone Encounter (Signed)
Refill- zyrtec  [facility] [address]

## 2014-01-10 ENCOUNTER — Ambulatory Visit (INDEPENDENT_AMBULATORY_CARE_PROVIDER_SITE_OTHER): Payer: BC Managed Care – PPO | Admitting: Family Medicine

## 2014-01-10 ENCOUNTER — Encounter: Payer: Self-pay | Admitting: Family Medicine

## 2014-01-10 VITALS — BP 130/92 | HR 97 | Temp 97.9°F | Ht 65.0 in | Wt 151.1 lb

## 2014-01-10 DIAGNOSIS — M25562 Pain in left knee: Secondary | ICD-10-CM

## 2014-01-10 DIAGNOSIS — R972 Elevated prostate specific antigen [PSA]: Secondary | ICD-10-CM

## 2014-01-10 DIAGNOSIS — M25561 Pain in right knee: Secondary | ICD-10-CM

## 2014-01-10 DIAGNOSIS — Z23 Encounter for immunization: Secondary | ICD-10-CM

## 2014-01-10 DIAGNOSIS — Z0000 Encounter for general adult medical examination without abnormal findings: Secondary | ICD-10-CM

## 2014-01-10 DIAGNOSIS — M25569 Pain in unspecified knee: Secondary | ICD-10-CM

## 2014-01-10 DIAGNOSIS — E785 Hyperlipidemia, unspecified: Secondary | ICD-10-CM

## 2014-01-10 MED ORDER — NAPROXEN SODIUM 220 MG PO TABS: 220.0000 mg | ORAL_TABLET | Freq: Two times a day (BID) | ORAL | Status: DC

## 2014-01-10 NOTE — Patient Instructions (Addendum)
No trans fats/partially hydrogenated oils Start a krill oil, MegaRed caps daily Minimize simple carbohydrates Increase exercise  Probiotics daily, such as Digestive Advantage, Phillip's Colon health Vitamin D 2000 IU daily   Cholesterol Cholesterol is a white, waxy, fat-like protein needed by your body in small amounts. The liver makes all the cholesterol you need. It is carried from the liver by the blood through the blood vessels. Deposits (plaque) may build up on blood vessel walls. This makes the arteries narrower and stiffer. Plaque increases the risk for heart attack and stroke. You cannot feel your cholesterol level even if it is very high. The only way to know is by a blood test to check your lipid (fats) levels. Once you know your cholesterol levels, you should keep a record of the test results. Work with your caregiver to to keep your levels in the desired range. WHAT THE RESULTS MEAN:  Total cholesterol is a rough measure of all the cholesterol in your blood.  LDL is the so-called bad cholesterol. This is the type that deposits cholesterol in the walls of the arteries. You want this level to be low.  HDL is the good cholesterol because it cleans the arteries and carries the LDL away. You want this level to be high.  Triglycerides are fat that the body can either burn for energy or store. High levels are closely linked to heart disease. DESIRED LEVELS:  Total cholesterol below 200.  LDL below 100 for people at risk, below 70 for very high risk.  HDL above 50 is good, above 60 is best.  Triglycerides below 150. HOW TO LOWER YOUR CHOLESTEROL:  Diet.  Choose fish or white meat chicken and [place], roasted or baked. Limit fatty cuts of red meat, fried foods, and processed meats, such as sausage and lunch meat.  Eat lots of fresh fruits and vegetables. Choose whole grains, beans, pasta, potatoes and cereals.  Use only small amounts of olive, corn or canola oils. Avoid  butter, mayonnaise, shortening or palm kernel oils. Avoid foods with trans-fats.  Use skim/nonfat milk and low-fat/nonfat yogurt and cheeses. Avoid whole milk, cream, ice cream, egg yolks and cheeses. Healthy desserts include angel food cake, ginger snaps, animal crackers, hard candy, popsicles, and low-fat/nonfat frozen yogurt. Avoid pastries, cakes, pies and cookies.  Exercise.  A regular program helps decrease LDL and raises HDL.  Helps with weight control.  Do things that increase your activity level like gardening, walking, or taking the stairs.  Medication.  May be prescribed by your caregiver to help lowering cholesterol and the risk for heart disease.  You may need medicine even if your levels are normal if you have several risk factors. HOME CARE INSTRUCTIONS   Follow your diet and exercise programs as suggested by your caregiver.  Take medications as directed.  Have blood work done when your caregiver feels it is necessary. MAKE SURE YOU:   Understand these instructions.  Will watch your condition.  Will get help right away if you are not doing well or get worse. Document Released: 04/15/2001 Document Revised: 10/13/2011 Document Reviewed: 05/04/2013 [facility] Patient Information 2014 [place], [place].

## 2014-01-10 NOTE — Progress Notes (Signed)
Pre visit review using our clinic review tool, if applicable. No additional management support is needed unless otherwise documented below in the visit note. 

## 2014-01-13 ENCOUNTER — Telehealth: Payer: Self-pay

## 2014-01-13 NOTE — Telephone Encounter (Signed)
Called pt, advised paperwork is ready to pick up. Left detailed message. Paperwork at front desk.

## 2014-01-16 ENCOUNTER — Encounter: Payer: Self-pay | Admitting: Family Medicine

## 2014-01-16 ENCOUNTER — Telehealth: Payer: Self-pay | Admitting: Family Medicine

## 2014-01-16 NOTE — Telephone Encounter (Signed)
Patient is requesting a new rx of advil 200mg  liquegels to be sent to [facility]. He states that he would like namebrand. Two boxes with 120 count in each box. He is requesting this so he can use his benny card.

## 2014-01-17 ENCOUNTER — Ambulatory Visit: Payer: BC Managed Care – PPO | Admitting: Family Medicine

## 2014-01-17 NOTE — Telephone Encounter (Signed)
OK with me please send as requested. For pain

## 2014-01-17 NOTE — Telephone Encounter (Signed)
Please advise 

## 2014-01-18 ENCOUNTER — Encounter: Payer: Self-pay | Admitting: Family Medicine

## 2014-01-18 DIAGNOSIS — E785 Hyperlipidemia, unspecified: Secondary | ICD-10-CM

## 2014-01-18 DIAGNOSIS — M25561 Pain in right knee: Secondary | ICD-10-CM | POA: Insufficient documentation

## 2014-01-18 DIAGNOSIS — M25562 Pain in left knee: Secondary | ICD-10-CM

## 2014-01-18 HISTORY — DX: Hyperlipidemia, unspecified: E78.5

## 2014-01-18 MED ORDER — IBUPROFEN 200 MG PO TABS: 200.0000 mg | ORAL_TABLET | Freq: Three times a day (TID) | ORAL | Status: DC | PRN

## 2014-01-18 NOTE — Progress Notes (Signed)
Patient ID: John Lane, male   DOB: 21-Jun-1961, 53 y.o.   MRN: [id number] John Lane [id number] [birth year] 01/18/2014      Progress Note-Follow Up  Subjective  Chief Complaint  Chief Complaint  Patient presents with  . Annual Exam    physical  . Injections    tdap    HPI  Patient is a 53 year old male in today for routine medical care. Patient in for annual exam. For the most part doing well. Has occasional knee pain but responds well to Aleve. Is requesting a refill. Denies any urinary symptoms status post his radical prostatectomy in 2013. No recent illness. Denies CP/palp/SOB/HA/congestion/fevers/GI or GU c/o. Taking meds as prescribed  Past Medical History  Diagnosis Date  . Allergy   . 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 H/O hematuria     2003  . H/O prostate cancer 08/28/2011  . Other and unspecified hyperlipidemia 01/18/2014    Past Surgical History  Procedure Laterality Date  . Appendectomy    . Tonsillectomy    . Robot assisted laparoscopic radical prostatectomy  05/12/2012    Procedure: ROBOTIC ASSISTED LAPAROSCOPIC RADICAL PROSTATECTOMY;  Surgeon: Bernestine Amass, MD;  Location: [facility];  Service: Urology;  Laterality: N/A;  . Circumcision  05/12/2012    Procedure: CIRCUMCISION ADULT;  Surgeon: Bernestine Amass, MD;  Location: [facility];  Service: Urology;  Laterality: N/A;    Family History  Problem Relation Age of Onset  . Coronary artery disease Brother   . Diabetes Father   . Hypertension Father   . Heart disease Father   . Hypertension Mother   . Arthritis Sister     History   Social History  . Marital Status: Married    Spouse Name: N/A    Number of Children: N/A  . Years of Education: N/A   Occupational History  . Not on file.   Social History Main Topics  . Smoking status: Former Smoker -- 0.50 packs/day for 10 years    Quit date: 08/05/1991  . Smokeless tobacco: Never Used  . Alcohol Use: 1.0 oz/week    2 drink(s) per  week     Comment: 1-2 weekly  . Drug Use: No  . Sexual Activity: Yes     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, no dietary restrictions.   Other Topics Concern  . Not on file   Social History Narrative   Married   Former smoker   Quit over 18 years ago-smoked for 10 years 1/2 ppd   3 children    Current Outpatient Prescriptions on File Prior to Visit  Medication Sig Dispense Refill  . acetaminophen (TYLENOL) 500 MG tablet Take 1 tablet (500 mg total) by mouth every 6 (six) hours as needed.  30 tablet  0  . BAYER ASPIRIN EC LOW DOSE 81 MG EC tablet TAKE ONE  BY MOUTH EVERY DAY AS NEEDED FOR PAIN  400 tablet  0  . fluticasone (FLONASE) 50 MCG/ACT nasal spray Place 2 sprays into both nostrils daily.  16 g  2  . antipyrine-benzocaine (AURALGAN) otic solution Place 3 drops into the left ear 4 (four) times daily as needed for pain.  10 mL  0  . bismuth subsalicylate (PEPTO BISMOL) 262 MG/15ML suspension Take 30 mLs by mouth as directed.  360 mL  5  . budesonide (RHINOCORT AQUA) 32 MCG/ACT nasal spray Place 1 spray into the nose daily.  8.6 g  5  . hydrocortisone cream 0.5 % Apply topically 2 (two) times daily.  90 g  0  . olopatadine (PATANOL) 0.1 % ophthalmic solution Place 1 drop into both eyes 2 (two) times daily as needed.      . sildenafil (VIAGRA) 100 MG tablet Take 0.5-1 tablets (50-100 mg total) by mouth daily as needed for erectile dysfunction.  9 tablet  3  . triamcinolone (NASACORT ALLERGY 24HR) 55 MCG/ACT AERO nasal inhaler Place 2 sprays into the nose daily.  1 Inhaler  12  . ZYRTEC-D ALLERGY & CONGESTION 5-120 MG per tablet Take 1 tablet by mouth daily as needed for allergies.  24 tablet  2   No current facility-administered medications on file prior to visit.    No Known Allergies  Review of Systems  Review of Systems  Constitutional: Negative for fever, chills and malaise/fatigue.  HENT: Negative for congestion, hearing loss and nosebleeds.   Eyes: Negative for  discharge.  Respiratory: Negative for cough, sputum production, shortness of breath and wheezing.   Cardiovascular: Negative for chest pain, palpitations and leg swelling.  Gastrointestinal: Negative for heartburn, nausea, vomiting, abdominal pain, diarrhea, constipation and blood in stool.  Genitourinary: Negative for dysuria, urgency, frequency and hematuria.  Musculoskeletal: Positive for joint pain. Negative for back pain, falls and myalgias.       Knee pain b/l stiff at times, Aleve helpful  Skin: Negative for rash.  Neurological: Negative for dizziness, tremors, sensory change, focal weakness, loss of consciousness, weakness and headaches.  Endo/Heme/Allergies: Negative for polydipsia. Does not bruise/bleed easily.  Psychiatric/Behavioral: Negative for depression and suicidal ideas. The patient is not nervous/anxious and does not have insomnia.     Objective  BP 130/92  Pulse 97  Temp(Src) 97.9 F (36.6 C) (Oral)  Ht 5\' 5"  (1.651 m)  Wt 151 lb 1.3 oz (68.529 kg)  BMI 25.14 kg/m2  SpO2 97%  Physical Exam  Physical Exam  Constitutional: He is oriented to person, place, and time and well-developed, well-nourished, and in no distress. No distress.  HENT:  Head: Normocephalic and atraumatic.  Eyes: Conjunctivae are normal.  Neck: Neck supple. No thyromegaly present.  Cardiovascular: Normal rate, regular rhythm and normal heart sounds.   No murmur heard. Pulmonary/Chest: Effort normal and breath sounds normal. No respiratory distress.  Abdominal: He exhibits no distension and no mass. There is no tenderness.  Musculoskeletal: He exhibits no edema.  Neurological: He is alert and oriented to person, place, and time.  Skin: Skin is warm.  Psychiatric: Memory, affect and judgment normal.    Lab Results  Component Value Date   TSH 1.809 11/15/2013   Lab Results  Component Value Date   WBC 5.5 11/15/2013   HGB 15.9 11/15/2013   HCT 44.5 11/15/2013   MCV 85.1 11/15/2013   PLT  209 11/15/2013   Lab Results  Component Value Date   CREATININE 0.85 11/15/2013   BUN 10 11/15/2013   NA 136 11/15/2013   K 4.1 11/15/2013   CL 101 11/15/2013   CO2 28 11/15/2013   Lab Results  Component Value Date   ALT 15 11/15/2013   AST 19 11/15/2013   ALKPHOS 49 11/15/2013   BILITOT 0.8 11/15/2013   Lab Results  Component Value Date   CHOL 178 11/15/2013   Lab Results  Component Value Date   HDL 46 11/15/2013   Lab Results  Component Value Date   LDLCALC 115* 11/15/2013   Lab Results  Component  Value Date   TRIG 87 11/15/2013   Lab Results  Component Value Date   CHOLHDL 3.9 11/15/2013     Assessment & Plan  Annual physical exam Patient encouraged to maintain heart healthy diet, regular exercise, adequate sleep. Consider daily probiotics. Take medications as prescribed. Given Tdap today  Knee pain, bilateral Requests rx for Aleve which does help. Is given rx, enocuraged to stay active  H/O prostate cancer PSA low on recent labs  Other and unspecified hyperlipidemia Encouraged heart healthy diet, increase exercise, avoid trans fats, consider a krill oil cap daily

## 2014-01-18 NOTE — Assessment & Plan Note (Signed)
Encouraged heart healthy diet, increase exercise, avoid trans fats, consider a krill oil cap daily 

## 2014-01-18 NOTE — Assessment & Plan Note (Addendum)
Patient encouraged to maintain heart healthy diet, regular exercise, adequate sleep. Consider daily probiotics. Take medications as prescribed. Given Tdap today

## 2014-01-18 NOTE — Assessment & Plan Note (Addendum)
PSA low on recent labs

## 2014-01-18 NOTE — Assessment & Plan Note (Signed)
Requests rx for Aleve which does help. Is given rx, enocuraged to stay active

## 2014-01-27 ENCOUNTER — Other Ambulatory Visit: Payer: Self-pay | Admitting: Family Medicine

## 2014-03-24 ENCOUNTER — Telehealth: Payer: Self-pay | Admitting: Family Medicine

## 2014-03-24 NOTE — Telephone Encounter (Signed)
Refill- cialis  [facility]

## 2014-03-24 NOTE — Telephone Encounter (Signed)
Pt stated that "he had the 30 day free trial of the Cialis and wanted to get more samples." I let him know that we didn't have any. " He stated that " he wishes he couldget a Rx sent downstairs for it but it would cost too much."LDM

## 2014-03-27 NOTE — Telephone Encounter (Signed)
FYI

## 2014-05-11 ENCOUNTER — Other Ambulatory Visit: Payer: Self-pay | Admitting: Family Medicine

## 2014-05-11 NOTE — Telephone Encounter (Signed)
Caller name: Clifford Relation to pt: Call back number: [phone] [facility]: [facility]  Reason for call:  Pt uses flex card, and is needing a script for Tylenol Arthritis 650 (name brand) mg to be sent to the pharmacy.  290 count tablets.

## 2014-06-15 ENCOUNTER — Other Ambulatory Visit: Payer: Self-pay

## 2014-06-15 MED ORDER — FLUTICASONE PROPIONATE 50 MCG/ACT NA SUSP: 2.0000 | Freq: Every day | NASAL | Status: DC

## 2014-06-26 ENCOUNTER — Other Ambulatory Visit: Payer: Self-pay | Admitting: Family Medicine

## 2014-06-28 ENCOUNTER — Other Ambulatory Visit: Payer: Self-pay | Admitting: Family Medicine

## 2014-06-28 ENCOUNTER — Telehealth: Payer: Self-pay | Admitting: Family Medicine

## 2014-06-28 MED ORDER — ZYRTEC-D ALLERGY & CONGESTION 5-120 MG PO TB12: ORAL_TABLET | ORAL | Status: DC

## 2014-06-28 MED ORDER — ACETAMINOPHEN 500 MG PO TABS: ORAL_TABLET | ORAL | Status: DC

## 2014-06-28 MED ORDER — IBUPROFEN 200 MG PO TABS: 200.0000 mg | ORAL_TABLET | Freq: Three times a day (TID) | ORAL | Status: DC | PRN

## 2014-06-28 MED ORDER — FLUTICASONE PROPIONATE 50 MCG/ACT NA SUSP: 2.0000 | Freq: Every day | NASAL | Status: DC

## 2014-06-28 NOTE — Telephone Encounter (Signed)
Refills sent on meds below x 2 refills each. Notified pt. He also states that he takes Tylenol Arthritis 650mg  as needed and wants Rx for that in addition to the one we sent for acetaminophen 500mg . Requests 90 day supply of zyrtec D and states he usually takes this daily and is refilling it every 30 days.  Pt asked if he could get 90 day supply of this as well?  Please advise. Pt is aware that it will be Monday before we will be able to return his call and he voices understanding.

## 2014-06-28 NOTE — Telephone Encounter (Signed)
°  Caller name: Reon Relation to pt: self  Call back number: [phone] [facility]  Reason for call: Pt uses flex card, and is needing a script for Tylenol Arthritis 650 (name brand) mg to be sent to the pharmacy. 290 count tablets. pt states this is he's 2nd request.  Pt requesting a 90 day supply for the follow RX (all name brand) he states he is extremely frustrated calling every month please advise   EQ ACETAMINOPHEN 500 MG tablet  ibuprofen (ADVIL,MOTRIN) 200 MG tablet  ZYRTEC-D ALLERGY & CONGESTION 5-120 MG per tablet fluticasone (FLONASE ALLERGY RELIEF) 50 MCG/ACT nasal spray

## 2014-06-30 NOTE — Telephone Encounter (Signed)
[facility] to write Rx for Tylenol 650 mg 1 tab po q 6 hours prn pain (max of 3000 mg Tylenol in 24 hours), disp #1 bottle, with 11 rf

## 2014-07-03 MED ORDER — ACETAMINOPHEN ER 650 MG PO TBCR: 650.0000 mg | EXTENDED_RELEASE_TABLET | Freq: Four times a day (QID) | ORAL | Status: DC | PRN

## 2014-08-22 ENCOUNTER — Other Ambulatory Visit: Payer: Self-pay | Admitting: Physician Assistant

## 2014-08-22 NOTE — Telephone Encounter (Signed)
Medication Detail      Disp Refills Start End     BAYER ASPIRIN EC LOW DOSE 81 MG EC tablet 400 tablet 0 06/28/2013     Sig: TAKE ONE BY MOUTH EVERY DAY AS NEEDED FOR PAIN    E-Prescribing Status: Receipt confirmed by pharmacy (06/28/2013 6:01 PM EST)      Rx request to pharmacy/SLS

## 2014-10-02 ENCOUNTER — Telehealth: Payer: Self-pay | Admitting: Family Medicine

## 2014-10-02 NOTE — Telephone Encounter (Signed)
Advise if ok to send in 

## 2014-10-02 NOTE — Telephone Encounter (Signed)
[facility] to send a prescription for Airborne, write use as directed on package for 7 days.

## 2014-10-02 NOTE — Telephone Encounter (Signed)
WANTS A PRESCRIPTION FOR AIRBORNE IMMUNE SUPPORT 51 GUMMIES   WANTS THIS SENT TO [facility] SO HE CAN BUY THIS ON HIS FLEXIBLE SPENDING

## 2014-10-03 MED ORDER — AIRBORNE PO CHEW: CHEWABLE_TABLET | ORAL | Status: DC

## 2014-10-03 NOTE — Telephone Encounter (Signed)
Sent in airborne as requested to [facility]

## 2014-11-06 ENCOUNTER — Telehealth: Payer: Self-pay | Admitting: Family Medicine

## 2014-11-06 NOTE — Telephone Encounter (Signed)
error 

## 2014-11-06 NOTE — Telephone Encounter (Signed)
Caller name:Sahr Tradarius Relation to IX:VEZB  Call back [phone] Pharmacy:[facility]  Reason for call: pt is needing rx budesonide (RHINOCORT AQUA) 32 MCG/ACT nasal spray  3 pkg, and simply saline 1,134 ml

## 2014-11-07 ENCOUNTER — Other Ambulatory Visit: Payer: Self-pay

## 2014-11-07 MED ORDER — BUDESONIDE 32 MCG/ACT NA SUSP: 1.0000 | Freq: Every day | NASAL | Status: DC

## 2014-11-07 NOTE — Telephone Encounter (Signed)
Left message for pt to call and schedule appt 

## 2014-11-07 NOTE — Telephone Encounter (Signed)
Patient was seen 01/10/14 for his cpe.

## 2014-11-07 NOTE — Telephone Encounter (Signed)
Unable to fill, Pt has not been seen by Dr. Charlett Blake since 10/2012. Must be seen.

## 2014-11-07 NOTE — Telephone Encounter (Signed)
Refilled to [facility] as requested.

## 2014-11-13 NOTE — Telephone Encounter (Signed)
Pt is checking on the status of he's simply saline request

## 2014-11-13 NOTE — Telephone Encounter (Signed)
Am willing to authorize both with a 30 day supply and 2 rf but if the Patanol requires a prior auth from his insurance he will need to come for visit to justify the paperwork and document need

## 2014-11-13 NOTE — Telephone Encounter (Signed)
Patient was last seen by Dr. Charlett Blake on 01/10/2014 and next scheduled appt. Is 02/23/15.  Due to insurance he would like a prescription for simple saline nose spray and would like his patanol refilled.  Advise if ok to fill both.

## 2014-11-14 MED ORDER — OLOPATADINE HCL 0.1 % OP SOLN: 1.0000 [drp] | Freq: Two times a day (BID) | OPHTHALMIC | Status: DC | PRN

## 2014-11-14 NOTE — Addendum Note (Signed)
Addended by: Sharon Seller B on: 11/14/2014 11:47 AM   Modules accepted: Orders, Medications

## 2014-11-14 NOTE — Addendum Note (Signed)
Addended by: Sharon Seller B on: 11/14/2014 08:38 AM   Modules accepted: Orders, Medications

## 2014-11-14 NOTE — Addendum Note (Signed)
Addended by: Sharon Seller B on: 11/14/2014 11:23 AM   Modules accepted: Orders

## 2014-11-14 NOTE — Telephone Encounter (Signed)
Called left msg. To call back 

## 2014-11-14 NOTE — Telephone Encounter (Signed)
Patient informed patanol refilled to [facility].

## 2014-11-27 ENCOUNTER — Other Ambulatory Visit: Payer: Self-pay | Admitting: Family Medicine

## 2015-02-06 ENCOUNTER — Encounter: Payer: Self-pay | Admitting: Family Medicine

## 2015-02-06 ENCOUNTER — Ambulatory Visit (INDEPENDENT_AMBULATORY_CARE_PROVIDER_SITE_OTHER): Payer: BLUE CROSS/BLUE SHIELD | Admitting: Family Medicine

## 2015-02-06 VITALS — BP 120/84 | HR 79 | Temp 97.7°F | Ht 65.0 in | Wt 149.4 lb

## 2015-02-06 DIAGNOSIS — T7840XD Allergy, unspecified, subsequent encounter: Secondary | ICD-10-CM

## 2015-02-06 DIAGNOSIS — Z0000 Encounter for general adult medical examination without abnormal findings: Secondary | ICD-10-CM | POA: Diagnosis not present

## 2015-02-06 DIAGNOSIS — M25511 Pain in right shoulder: Secondary | ICD-10-CM | POA: Diagnosis not present

## 2015-02-06 DIAGNOSIS — B350 Tinea barbae and tinea capitis: Secondary | ICD-10-CM

## 2015-02-06 DIAGNOSIS — E785 Hyperlipidemia, unspecified: Secondary | ICD-10-CM | POA: Diagnosis not present

## 2015-02-06 DIAGNOSIS — M25561 Pain in right knee: Secondary | ICD-10-CM | POA: Diagnosis not present

## 2015-02-06 MED ORDER — SALINE 0.9 % NA AERS: 2.0000 | INHALATION_SPRAY | Freq: Two times a day (BID) | NASAL | Status: DC

## 2015-02-06 NOTE — Patient Instructions (Signed)
Salon Pas gel and/or patches  Curcumen/Turmeric caps 1 daily   Preventive Care for Adults A healthy lifestyle and preventive care can promote health and wellness. Preventive health guidelines for men include the following key practices:  A routine yearly physical is a good way to check with your health care provider about your health and preventative screening. It is a chance to share any concerns and updates on your health and to receive a thorough exam.  Visit your dentist for a routine exam and preventative care every 6 months. Brush your teeth twice a day and floss once a day. Good oral hygiene prevents tooth decay and gum disease.  The frequency of eye exams is based on your age, health, family medical history, use of contact lenses, and other factors. Follow your health care provider's recommendations for frequency of eye exams.  Eat a healthy diet. Foods such as vegetables, fruits, whole grains, low-fat dairy pr    oducts, and lean protein foods contain the nutrients you need without too many calories. Decrease your intake of foods high in solid fats, added sugars, and salt. Eat the right amount of calories for you.Get information about a proper diet from your health care provider, if necessary.  Regular physical exercise is one of the most important things you can do for your health. Most adults should get at least 150 minutes of moderate-intensity exercise (any activity that increases your heart rate and causes you to sweat) each week. In addition, most adults need muscle-strengthening exercises on 2 or more days a week.  Maintain a healthy weight. The body mass index (BMI) is a screening tool to identify possible weight problems. It provides an estimate of body fat based on height and weight. Your health care provider can find your BMI and can help you achieve or maintain a healthy weight.For adults 20 years and older:  A BMI below 18.5 is considered underweight.  A BMI of 18.5  to 24.9 is normal.  A BMI of 25 to 29.9 is considered overweight.  A BMI of 30 and above is considered obese.0  Maintain normal blood lipids and cholesterol levels by exercising and minimizing your intake of saturated fat. Eat a balanced diet with plenty of fruit and vegetables. Blood tests for lipids and cholesterol should begin at age 6 and be repeated every 5 years. If your lipid or cholesterol levels are high, you are over 50, or you are at high risk for heart disease, you may need your cholesterol levels checked more frequently.Ongoing high lipid and cholesterol levels should be treated with medicines if diet and exercise are not working.  If you smoke, find out from your health care provider how to quit. If you do not use tobacco, do not start.  Lung cancer screening is recommended for adults aged 17-80 years who are at high risk for developing lung cancer because of a history of smoking. A yearly low-dose CT scan of the lungs is recommended for people who have at least a 30-pack-year history of smoking and are a current smoker or have quit within the past 15 years. A pack year of smoking is smoking an average of 1 pack of cigarettes a day for 1 year (for example: 1 pack a day for 30 years or 2 packs a day for 15 years). Yearly screening should continue until the smoker has stopped smoking for at least 15 years. Yearly screening should be stopped for people who develop a health problem that would prevent them from  having lung cancer treatment.  If you choose to drink alcohol, do not have more than 2 drinks per day. One drink is considered to be 12 ounces (355 mL) of beer, 5 ounces (148 mL) of wine, or 1.5 ounces (44 mL) of liquor.  Avoid use of street drugs. Do not share needles with anyone. Ask for help if you need support or instructions about stopping the use of drugs.  High blood pressure causes heart disease and increases the risk of stroke. Your blood pressure should be checked at least  every 1-2 years. Ongoing high blood pressure should be treated with medicines, if weight loss and exercise are not effective.  If you are 45-39 years old, ask your health care provider if you should take aspirin to prevent heart disease.  Diabetes screening involves taking a blood sample to check your fasting blood sugar level. This should be done once every 3 years, after age 64, if you are within normal weight and without risk factors for diabetes. Testing should be considered at a younger age or be carried out more frequently if you are overweight and have at least 1 risk factor for diabetes.  Colorectal cancer can be detected and often prevented. Most routine colorectal cancer screening begins at the age of 41 and continues through age 71. However, your health care provider may recommend screening at an earlier age if you have risk factors for colon cancer. On a yearly basis, your health care provider may provide home test kits to check for hidden blood in the stool. Use of a small camera at the end of a tube to directly examine the colon (sigmoidoscopy or colonoscopy) can detect the earliest forms of colorectal cancer. Talk to your health care provider about this at age 40, when routine screening begins. Direct exam of the colon should be repeated every 5-10 years through age 74, unless early forms of precancerous polyps or small growths are found.  People who are at an increased risk for hepatitis B should be screened for this virus. You are considered at high risk for hepatitis B if:  You were born in a country where hepatitis B occurs often. Talk with your health care provider about which countries are considered high risk.  Your parents were born in a high-risk country and you have not received a shot to protect against hepatitis B (hepatitis B vaccine).  You have HIV or AIDS.  You use needles to inject street drugs.  You live with, or have sex with, someone who has hepatitis B.  You are  a man who has sex with other men (MSM).  You get hemodialysis treatment.  You take certain medicines for conditions such as cancer, organ transplantation, and autoimmune conditions.  Hepatitis C blood testing is recommended for all people born from [birth year] through 1965 and any individual with known risks for hepatitis C.  Practice safe sex. Use condoms and avoid high-risk sexual practices to reduce the spread of sexually transmitted infections (STIs). STIs include gonorrhea, chlamydia, syphilis, trichomonas, herpes, HPV, and human immunodeficiency virus (HIV). Herpes, HIV, and HPV are viral illnesses that have no cure. They can result in disability, cancer, and death.  If you are at risk of being infected with HIV, it is recommended that you take a prescription medicine daily to prevent HIV infection. This is called preexposure prophylaxis (PrEP). You are considered at risk if:  You are a man who has sex with other men (MSM) and have other risk factors.  You are a heterosexual man, are sexually active, and are at increased risk for HIV infection.  You take drugs by injection.  You are sexually active with a partner who has HIV.  Talk with your health care provider about whether you are at high risk of being infected with HIV. If you choose to begin PrEP, you should first be tested for HIV. You should then be tested every 3 months for as long as you are taking PrEP.  A one-time screening for abdominal aortic aneurysm (AAA) and surgical repair of large AAAs by ultrasound are recommended for men ages 49 to 68 years who are current or former smokers.  Healthy men should no longer receive prostate-specific antigen (PSA) blood tests as part of routine cancer screening. Talk with your health care provider about prostate cancer screening.  Testicular cancer screening is not recommended for adult males who have no symptoms. Screening includes self-exam, a health care provider exam, and other screening  tests. Consult with your health care provider about any symptoms you have or any concerns you have about testicular cancer.  Use sunscreen. Apply sunscreen liberally and repeatedly throughout the day. You should seek shade when your shadow is shorter than you. Protect yourself by wearing long sleeves, pants, a wide-brimmed hat, and sunglasses year round, whenever you are outdoors.  Once a month, do a whole-body skin exam, using a mirror to look at the skin on your back. Tell your health care provider about new moles, moles that have irregular borders, moles that are larger than a pencil eraser, or moles that have changed in shape or color.  Stay current with required vaccines (immunizations).  Influenza vaccine. All adults should be immunized every year.  Tetanus, diphtheria, and acellular pertussis (Td, Tdap) vaccine. An adult who has not previously received Tdap or who does not know his vaccine status should receive 1 dose of Tdap. This initial dose should be followed by tetanus and diphtheria toxoids (Td) booster doses every 10 years. Adults with an unknown or incomplete history of completing a 3-dose immunization series with Td-containing vaccines should begin or complete a primary immunization series including a Tdap dose. Adults should receive a Td booster every 10 years.  Varicella vaccine. An adult without evidence of immunity to varicella should receive 2 doses or a second dose if he has previously received 1 dose.  Human papillomavirus (HPV) vaccine. Males aged 58-21 years who have not received the vaccine previously should receive the 3-dose series. Males aged 22-26 years may be immunized. Immunization is recommended through the age of 26 years for any male who has sex with males and did not get any or all doses earlier. Immunization is recommended for any person with an immunocompromised condition through the age of 81 years if he did not get any or all doses earlier. During the 3-dose  series, the second dose should be obtained 4-8 weeks after the first dose. The third dose should be obtained 24 weeks after the first dose and 16 weeks after the second dose.  Zoster vaccine. One dose is recommended for adults aged 53 years or older unless certain conditions are present.  Measles, mumps, and rubella (MMR) vaccine. Adults born before [birth year] generally are considered immune to measles and mumps. Adults born in [birth year] or later should have 1 or more doses of MMR vaccine unless there is a contraindication to the vaccine or there is laboratory evidence of immunity to each of the three diseases. A routine second dose of  MMR vaccine should be obtained at least 28 days after the first dose for students attending postsecondary schools, health care workers, or international travelers. People who received inactivated measles vaccine or an unknown type of measles vaccine during 1963-1967 should receive 2 doses of MMR vaccine. People who received inactivated mumps vaccine or an unknown type of mumps vaccine before 1979 and are at high risk for mumps infection should consider immunization with 2 doses of MMR vaccine. Unvaccinated health care workers born before [birth year] who lack laboratory evidence of measles, mumps, or rubella immunity or laboratory confirmation of disease should consider measles and mumps immunization with 2 doses of MMR vaccine or rubella immunization with 1 dose of MMR vaccine.  Pneumococcal 13-valent conjugate (PCV13) vaccine. When indicated, a person who is uncertain of his immunization history and has no record of immunization should receive the PCV13 vaccine. An adult aged 3 years or older who has certain medical conditions and has not been previously immunized should receive 1 dose of PCV13 vaccine. This PCV13 should be followed with a dose of pneumococcal polysaccharide (PPSV23) vaccine. The PPSV23 vaccine dose should be obtained at least 8 weeks after the dose of PCV13 vaccine. An adult  aged 19 years or older who has certain medical conditions and previously received 1 or more doses of PPSV23 vaccine should receive 1 dose of PCV13. The PCV13 vaccine dose should be obtained 1 or more years after the last PPSV23 vaccine dose.  Pneumococcal polysaccharide (PPSV23) vaccine. When PCV13 is also indicated, PCV13 should be obtained first. All adults aged 13 years and older should be immunized. An adult younger than age 80 years who has certain medical conditions should be immunized. Any person who resides in a nursing home or long-term care facility should be immunized. An adult smoker should be immunized. People with an immunocompromised condition and certain other conditions should receive both PCV13 and PPSV23 vaccines. People with human immunodeficiency virus (HIV) infection should be immunized as soon as possible after diagnosis. Immunization during chemotherapy or radiation therapy should be avoided. Routine use of PPSV23 vaccine is not recommended for American Indians, [place] Natives, or people younger than 65 years unless there are medical conditions that require PPSV23 vaccine. When indicated, people who have unknown immunization and have no record of immunization should receive PPSV23 vaccine. One-time revaccination 5 years after the first dose of PPSV23 is recommended for people aged 19-64 years who have chronic kidney failure, nephrotic syndrome, asplenia, or immunocompromised conditions. People who received 1-2 doses of PPSV23 before age 59 years should receive another dose of PPSV23 vaccine at age 9 years or later if at least 5 years have passed since the previous dose. Doses of PPSV23 are not needed for people immunized with PPSV23 at or after age 74 years.  Meningococcal vaccine. Adults with asplenia or persistent complement component deficiencies should receive 2 doses of quadrivalent meningococcal conjugate (MenACWY-D) vaccine. The doses should be obtained at least 2 months apart.  Microbiologists working with certain meningococcal bacteria, [place] recruits, people at risk during an outbreak, and people who travel to or live in countries with a high rate of meningitis should be immunized. A first-year college student up through age 12 years who is living in a residence hall should receive a dose if he did not receive a dose on or after his 16th birthday. Adults who have certain high-risk conditions should receive one or more doses of vaccine.  Hepatitis A vaccine. Adults who wish to be protected from this  disease, have certain high-risk conditions, work with hepatitis A-infected animals, work in hepatitis A research labs, or travel to or work in countries with a high rate of hepatitis A should be immunized. Adults who were previously unvaccinated and who anticipate close contact with an international adoptee during the first 60 days after arrival in the [place] from a country with a high rate of hepatitis A should be immunized.  Hepatitis B vaccine. Adults should be immunized if they wish to be protected from this disease, have certain high-risk conditions, may be exposed to blood or other infectious body fluids, are household contacts or sex partners of hepatitis B positive people, are clients or workers in certain care facilities, or travel to or work in countries with a high rate of hepatitis B.  Haemophilus influenzae type b (Hib) vaccine. A previously unvaccinated person with asplenia or sickle cell disease or having a scheduled splenectomy should receive 1 dose of Hib vaccine. Regardless of previous immunization, a recipient of a hematopoietic stem cell transplant should receive a 3-dose series 6-12 months after his successful transplant. Hib vaccine is not recommended for adults with HIV infection. Preventive Service / Frequency Ages 33 to 91  Blood pressure check.** / Every 1 to 2 years.  Lipid and cholesterol check.** / Every 5 years beginning at age  22.  Hepatitis C blood test.** / For any individual with known risks for hepatitis C.  Skin self-exam. / Monthly.  Influenza vaccine. / Every year.  Tetanus, diphtheria, and acellular pertussis (Tdap, Td) vaccine.** / Consult your health care provider. 1 dose of Td every 10 years.  Varicella vaccine.** / Consult your health care provider.  HPV vaccine. / 3 doses over 6 months, if 4 or younger.  Measles, mumps, rubella (MMR) vaccine.** / You need at least 1 dose of MMR if you were born in 1957 or later. You may also need a second dose.  Pneumococcal 13-valent conjugate (PCV13) vaccine.** / Consult your health care provider.  Pneumococcal polysaccharide (PPSV23) vaccine.** / 1 to 2 doses if you smoke cigarettes or if you have certain conditions.  Meningococcal vaccine.** / 1 dose if you are age 53 to 60 years and a Market researcher living in a residence hall, or have one of several medical conditions. You may also need additional booster doses.  Hepatitis A vaccine.** / Consult your health care provider.  Hepatitis B vaccine.** / Consult your health care provider.  Haemophilus influenzae type b (Hib) vaccine.** / Consult your health care provider. Ages 79 to 66  Blood pressure check.** / Every 1 to 2 years.  Lipid and cholesterol check.** / Every 5 years beginning at age 36.  Lung cancer screening. / Every year if you are aged 27-80 years and have a 30-pack-year history of smoking and currently smoke or have quit within the past 15 years. Yearly screening is stopped once you have quit smoking for at least 15 years or develop a health problem that would prevent you from having lung cancer treatment.  Fecal occult blood test (FOBT) of stool. / Every year beginning at age 53 and continuing until age 77. You may not have to do this test if you get a colonoscopy every 10 years.  Flexible sigmoidoscopy** or colonoscopy.** / Every 5 years for a flexible sigmoidoscopy or every  10 years for a colonoscopy beginning at age 93 and continuing until age 21.  Hepatitis C blood test.** / For all people born from [birth year] through 1965 and any  individual with known risks for hepatitis C.  Skin self-exam. / Monthly.  Influenza vaccine. / Every year.  Tetanus, diphtheria, and acellular pertussis (Tdap/Td) vaccine.** / Consult your health care provider. 1 dose of Td every 10 years.  Varicella vaccine.** / Consult your health care provider.  Zoster vaccine.** / 1 dose for adults aged 35 years or older.  Measles, mumps, rubella (MMR) vaccine.** / You need at least 1 dose of MMR if you were born in 1957 or later. You may also need a second dose.  Pneumococcal 13-valent conjugate (PCV13) vaccine.** / Consult your health care provider.  Pneumococcal polysaccharide (PPSV23) vaccine.** / 1 to 2 doses if you smoke cigarettes or if you have certain conditions.  Meningococcal vaccine.** / Consult your health care provider.  Hepatitis A vaccine.** / Consult your health care provider.  Hepatitis B vaccine.** / Consult your health care provider.  Haemophilus influenzae type b (Hib) vaccine.** / Consult your health care provider. Ages 73 and over  Blood pressure check.** / Every 1 to 2 years.  Lipid and cholesterol check.**/ Every 5 years beginning at age 51.  Lung cancer screening. / Every year if you are aged 8-80 years and have a 30-pack-year history of smoking and currently smoke or have quit within the past 15 years. Yearly screening is stopped once you have quit smoking for at least 15 years or develop a health problem that would prevent you from having lung cancer treatment.  Fecal occult blood test (FOBT) of stool. / Every year beginning at age 58 and continuing until age 106. You may not have to do this test if you get a colonoscopy every 10 years.  Flexible sigmoidoscopy** or colonoscopy.** / Every 5 years for a flexible sigmoidoscopy or every 10 years for a colonoscopy  beginning at age 70 and continuing until age 44.  Hepatitis C blood test.** / For all people born from [birth year] through 1965 and any individual with known risks for hepatitis C.  Abdominal aortic aneurysm (AAA) screening.** / A one-time screening for ages 62 to 3 years who are current or former smokers.  Skin self-exam. / Monthly.  Influenza vaccine. / Every year.  Tetanus, diphtheria, and acellular pertussis (Tdap/Td) vaccine.** / 1 dose of Td every 10 years.  Varicella vaccine.** / Consult your health care provider.  Zoster vaccine.** / 1 dose for adults aged 56 years or older.  Pneumococcal 13-valent conjugate (PCV13) vaccine.** / Consult your health care provider.  Pneumococcal polysaccharide (PPSV23) vaccine.** / 1 dose for all adults aged 40 years and older.  Meningococcal vaccine.** / Consult your health care provider.  Hepatitis A vaccine.** / Consult your health care provider.  Hepatitis B vaccine.** / Consult your health care provider.  Haemophilus influenzae type b (Hib) vaccine.** / Consult your health care provider. **Family history and personal history of risk and conditions may change your health care provider's recommendations. Document Released: 09/16/2001 Document Revised: 07/26/2013 Document Reviewed: 12/16/2010 [facility] Patient Information 2015 [place], [place]. This information is not intended to replace advice given to you by your health care provider. Make sure you discuss any questions you have with your health care provider.

## 2015-02-06 NOTE — Progress Notes (Signed)
John Lane  [id number] [birth year] 02/06/2015      Progress Note-Follow Up  Subjective  Chief Complaint  Chief Complaint  Patient presents with  . Annual Exam    HPI  Patient is a 54 y.o. male in today for routine medical care. Patient is in today for annual exam. Overall feeling well but does note some persistent trouble with his right shoulder. 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 He also notes intermittent trouble with allergies and is requesting a prescription for nasal saline for his flex account he finds this helpful when used intermittently. No other recent illness or acute complaints. Denies CP/palp/SOB/HA/congestion/fevers/GI or GU c/o. Taking meds as prescribed  Past Medical History  Diagnosis Date  . Allergy   . 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 H/O hematuria     2003  . H/O prostate cancer 08/28/2011  . Other and unspecified hyperlipidemia 01/18/2014    Past Surgical History  Procedure Laterality Date  . Appendectomy    . Tonsillectomy    . Robot assisted laparoscopic radical prostatectomy  05/12/2012    Procedure: ROBOTIC ASSISTED LAPAROSCOPIC RADICAL PROSTATECTOMY;  Surgeon: Bernestine Amass, MD;  Location: [facility];  Service: Urology;  Laterality: N/A;  . Circumcision  05/12/2012    Procedure: CIRCUMCISION ADULT;  Surgeon: Bernestine Amass, MD;  Location: [facility];  Service: Urology;  Laterality: N/A;    Family History  Problem Relation Age of Onset  . Coronary artery disease Brother   . Diabetes Father   . Hypertension Father   . Heart disease Father   . Hypertension Mother   . Arthritis Sister     History   Social History  . Marital Status: Married    Spouse Name: N/A  . Number of Children: N/A  . Years of Education: N/A   Occupational History  . Not on file.   Social History Main Topics  . Smoking status: Former Smoker -- 0.50 packs/day for 10 years    Quit date: 08/05/1991  .  Smokeless tobacco: Never Used  . Alcohol Use: 1.0 oz/week    2 drink(s) per week     Comment: 1-2 weekly  . Drug Use: No  . Sexual Activity: Yes     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, no dietary restrictions.   Other Topics Concern  . Not on file   Social History Narrative   Married   Former smoker   Quit over 18 years ago-smoked for 10 years 1/2 ppd   3 children    Current Outpatient Prescriptions on File Prior to Visit  Medication Sig Dispense Refill  . acetaminophen (TYLENOL) 650 MG CR tablet Take 1 tablet (650 mg total) by mouth every 6 (six) hours as needed for pain. Max of 3000 mg tylenol in 24 hours 90 tablet 11  . aspirin (BAYER LOW DOSE) 81 MG EC tablet Take 1 tablet (81 mg total) by mouth daily as needed for pain. 400 tablet 0  . budesonide (RHINOCORT AQUA) 32 MCG/ACT nasal spray Place 1 spray into both nostrils daily. 8.6 g 5  . fluticasone (FLONASE ALLERGY RELIEF) 50 MCG/ACT nasal spray Place 2 sprays into both nostrils daily. 16 g 2  . Multiple Vitamins-Minerals (AIRBORNE) CHEW Use as directed by package for 7 days 30 tablet 1  . sildenafil (VIAGRA) 100 MG tablet Take 0.5-1 tablets (50-100 mg total) by mouth daily as needed for erectile  dysfunction. 9 tablet 3  . ZYRTEC-D ALLERGY & CONGESTION 5-120 MG per tablet TAKE ONE TABLET BY MOUTH ONCE DAILY AS NEEDED FOR ALLERGY 30 tablet 2  . bismuth subsalicylate (PEPTO BISMOL) 262 MG/15ML suspension Take 30 mLs by mouth as directed. (Patient not taking: Reported on 02/06/2015) 360 mL 5  . ibuprofen (ADVIL,MOTRIN) 200 MG tablet Take 1 tablet (200 mg total) by mouth 3 (three) times daily as needed. Advil Liqui-gel (Patient not taking: Reported on 02/06/2015) 240 tablet 2  . naproxen sodium (ANAPROX) 220 MG tablet TAKE ONE CAPSULE BY MOUTH TWICE DAILY AS NEEDED (Patient not taking: Reported on 02/06/2015) 160 tablet 0  . olopatadine (PATANOL) 0.1 % ophthalmic solution Place 1 drop into both eyes 2 (two) times daily as needed. (Patient not  taking: Reported on 02/06/2015) 5 mL 1  . triamcinolone (NASACORT ALLERGY 24HR) 55 MCG/ACT AERO nasal inhaler Place 2 sprays into the nose daily. (Patient not taking: Reported on 02/06/2015) 1 Inhaler 12   No current facility-administered medications on file prior to visit.    No Known Allergies  Review of Systems  Review of Systems  Constitutional: Negative for fever, chills and malaise/fatigue.  HENT: Positive for congestion. Negative for hearing loss and nosebleeds.   Eyes: Negative for discharge.  Respiratory: Negative for cough, sputum production, shortness of breath and wheezing.   Cardiovascular: Negative for chest pain, palpitations and leg swelling.  Gastrointestinal: Negative for heartburn, nausea, vomiting, abdominal pain, diarrhea, constipation and blood in stool.  Genitourinary: Negative for dysuria, urgency, frequency and hematuria.  Musculoskeletal: Positive for joint pain. Negative for myalgias, back pain and falls.       Right shoulder  Skin: Negative for rash.  Neurological: Negative for dizziness, tremors, sensory change, focal weakness, loss of consciousness, weakness and headaches.  Endo/Heme/Allergies: Negative for polydipsia. Does not bruise/bleed easily.  Psychiatric/Behavioral: Negative for depression and suicidal ideas. The patient is not nervous/anxious and does not have insomnia.     Objective  BP 120/84 mmHg  Pulse 79  Temp(Src) 97.7 F (36.5 C) (Oral)  Ht 5\' 5"  (1.651 m)  Wt 149 lb 6 oz (67.756 kg)  BMI 24.86 kg/m2  SpO2 97%  Physical Exam  Physical Exam  Constitutional: He is oriented to person, place, and time and well-developed, well-nourished, and in no distress. No distress.  HENT:  Head: Normocephalic and atraumatic.  Eyes: Conjunctivae are normal.  Neck: Neck supple. No thyromegaly present.  Cardiovascular: Normal rate, regular rhythm and normal heart sounds.   No murmur heard. Pulmonary/Chest: Effort normal and breath sounds normal. No  respiratory distress.  Abdominal: He exhibits no distension and no mass. There is no tenderness.  Musculoskeletal: He exhibits no edema.  Neurological: He is alert and oriented to person, place, and time.  Skin: Skin is warm.  Psychiatric: Memory, affect and judgment normal.    Lab Results  Component Value Date   TSH 1.809 11/15/2013   Lab Results  Component Value Date   WBC 5.5 11/15/2013   HGB 15.9 11/15/2013   HCT 44.5 11/15/2013   MCV 85.1 11/15/2013   PLT 209 11/15/2013   Lab Results  Component Value Date   CREATININE 0.85 11/15/2013   BUN 10 11/15/2013   NA 136 11/15/2013   K 4.1 11/15/2013   CL 101 11/15/2013   CO2 28 11/15/2013   Lab Results  Component Value Date   ALT 15 11/15/2013   AST 19 11/15/2013   ALKPHOS 49 11/15/2013   BILITOT 0.8 11/15/2013  Lab Results  Component Value Date   CHOL 178 11/15/2013   Lab Results  Component Value Date   HDL 46 11/15/2013   Lab Results  Component Value Date   LDLCALC 115* 11/15/2013   Lab Results  Component Value Date   TRIG 87 11/15/2013   Lab Results  Component Value Date   CHOLHDL 3.9 11/15/2013     Assessment & Plan  Hyperlipidemia Encouraged heart healthy diet, increase exercise, avoid trans fats, consider a krill oil cap daily  TINEA BARBAE No recent flares  Annual physical exam Patient encouraged to maintain heart healthy diet, regular exercise, adequate sleep. Consider daily probiotics. Take medications as prescribed. Patient encouraged to maintain heart healthy diet, regular exercise, adequate sleep. Consider daily probiotics. Take medications as prescribed. Given and reviewed copy of ACP documents from [place] of [phone] and encouraged to complete and return  Allergic state Nasal saline prn, zyrtec prn  Pain in joint, shoulder region Shoulder and knee pain noted intermittently for quite some time no recent illness. Is referred to sports med for further treatment and  evaluation

## 2015-02-06 NOTE — Assessment & Plan Note (Signed)
Encouraged heart healthy diet, increase exercise, avoid trans fats, consider a krill oil cap daily 

## 2015-02-06 NOTE — Progress Notes (Signed)
Pre visit review using our clinic review tool, if applicable. No additional management support is needed unless otherwise documented below in the visit note. 

## 2015-02-07 LAB — TSH: TSH: 1.29 u[IU]/mL (ref 0.35–4.50)

## 2015-02-07 LAB — CBC
HEMATOCRIT: 45.1 % (ref 39.0–52.0)
HEMOGLOBIN: 15.2 g/dL (ref 13.0–17.0)
MCHC: 33.7 g/dL (ref 30.0–36.0)
MCV: 91.2 fl (ref 78.0–100.0)
Platelets: 220 10*3/uL (ref 150.0–400.0)
RBC: 4.95 Mil/uL (ref 4.22–5.81)
RDW: 12.4 % (ref 11.5–15.5)
WBC: 6.2 10*3/uL (ref 4.0–10.5)

## 2015-02-07 LAB — LIPID PANEL
CHOL/HDL RATIO: 4
CHOLESTEROL: 174 mg/dL (ref 0–200)
HDL: 45.6 mg/dL (ref >39.00)
NONHDL: 128.4
Triglycerides: 221 mg/dL — ABNORMAL HIGH (ref 0.0–149.0)
VLDL: 44.2 mg/dL — ABNORMAL HIGH (ref 0.0–40.0)

## 2015-02-07 LAB — COMPREHENSIVE METABOLIC PANEL
ALT: 15 U/L (ref 0–53)
AST: 19 U/L (ref 0–37)
Albumin: 4 g/dL (ref 3.5–5.2)
Alkaline Phosphatase: 46 U/L (ref 39–117)
BILIRUBIN TOTAL: 0.5 mg/dL (ref 0.2–1.2)
BUN: 15 mg/dL (ref 6–23)
CO2: 30 meq/L (ref 19–32)
CREATININE: 1.32 mg/dL (ref 0.40–1.50)
Calcium: 9.2 mg/dL (ref 8.4–10.5)
Chloride: 101 mEq/L (ref 96–112)
GFR: 72.62 mL/min (ref >60.00)
Glucose, Bld: 76 mg/dL (ref 70–99)
Potassium: 4.4 mEq/L (ref 3.5–5.1)
Sodium: 138 mEq/L (ref 135–145)
Total Protein: 7 g/dL (ref 6.0–8.3)

## 2015-02-07 LAB — LDL CHOLESTEROL, DIRECT: Direct LDL: 99 mg/dL

## 2015-02-11 ENCOUNTER — Encounter: Payer: Self-pay | Admitting: Family Medicine

## 2015-02-11 DIAGNOSIS — M25519 Pain in unspecified shoulder: Secondary | ICD-10-CM | POA: Insufficient documentation

## 2015-02-11 DIAGNOSIS — T7840XA Allergy, unspecified, initial encounter: Secondary | ICD-10-CM

## 2015-02-11 HISTORY — DX: Allergy, unspecified, initial encounter: T78.40XA

## 2015-02-11 NOTE — Assessment & Plan Note (Signed)
Nasal saline prn, zyrtec prn

## 2015-02-11 NOTE — Assessment & Plan Note (Signed)
Shoulder and knee pain noted intermittently for quite some time no recent illness. Is referred to sports med for further treatment and evaluation

## 2015-02-11 NOTE — Assessment & Plan Note (Signed)
No recent flares 

## 2015-02-11 NOTE — Assessment & Plan Note (Signed)
Patient encouraged to maintain heart healthy diet, regular exercise, adequate sleep. Consider daily probiotics. Take medications as prescribed. Patient encouraged to maintain heart healthy diet, regular exercise, adequate sleep. Consider daily probiotics. Take medications as prescribed. Given and reviewed copy of ACP documents from [employer] and encouraged to complete and return

## 2015-02-12 ENCOUNTER — Ambulatory Visit: Payer: BLUE CROSS/BLUE SHIELD | Admitting: Family Medicine

## 2015-02-13 ENCOUNTER — Encounter: Payer: Self-pay | Admitting: Family Medicine

## 2015-02-15 ENCOUNTER — Telehealth: Payer: Self-pay | Admitting: *Deleted

## 2015-02-15 ENCOUNTER — Telehealth: Payer: Self-pay | Admitting: Family Medicine

## 2015-02-15 NOTE — Telephone Encounter (Signed)
Pt dropped off health history form. Completed as much as possible and forwarded to Dr. Charlett Blake. JG//CMA

## 2015-02-15 NOTE — Telephone Encounter (Signed)
Caller name: Penny  Relation to pt: Self  Call back number: [phone] Pharmacy:  Reason for call: Pt came in office leaving document to be filled out for health history from his physical and pt is wanting to speak with Dr. Charlett Blake nurse to consult about his meds. Please advise.

## 2015-02-15 NOTE — Telephone Encounter (Signed)
Called the patient and confirmed he wanted to know what OTC for pain you mentioned to use at his OV.  I did go over with him as documented on AVS to use salon pas gel or patch and curcumen/turmeric.  He agreed to do. But he has a benny card, is it ok to send in prescription for both?? If so I can do, he wants to go to [facility].

## 2015-02-15 NOTE — Telephone Encounter (Signed)
It is fine to write prescriptions for both, thanks. You may have trouble finding in [facility], Curcumen is usually 300 to 500 mg caps and any strength is fine. 1 daily and the patches are to be used bid

## 2015-02-16 MED ORDER — CAMPHOR-MENTHOL-METHYL SAL 1.2-5.7-6.3 % EX PTCH: 1.0000 | MEDICATED_PATCH | Freq: Two times a day (BID) | CUTANEOUS | Status: DC

## 2015-02-16 MED ORDER — TURMERIC CURCUMIN 500 MG PO CAPS: 500.0000 mg | ORAL_CAPSULE | Freq: Every day | ORAL | Status: DC

## 2015-02-16 NOTE — Telephone Encounter (Signed)
Sent in OTC as instructed by PCP.  Called the patient informed request done.

## 2015-02-19 ENCOUNTER — Other Ambulatory Visit: Payer: Self-pay | Admitting: Family Medicine

## 2015-02-19 MED ORDER — TURMERIC CURCUMIN 500 MG PO CAPS: 500.0000 mg | ORAL_CAPSULE | Freq: Every day | ORAL | Status: AC

## 2015-02-19 NOTE — Telephone Encounter (Signed)
Sent in OTC tumeric/curcumon #250 due to count available at pharmacy on the shelf.

## 2015-02-20 NOTE — Telephone Encounter (Signed)
Completed forms placed up front for pt to pick up, pt aware. Copy sent for scanning. JG//CMA

## 2015-02-23 ENCOUNTER — Encounter: Payer: Self-pay | Admitting: Family Medicine

## 2015-02-26 ENCOUNTER — Encounter: Payer: Self-pay | Admitting: Family Medicine

## 2015-02-28 ENCOUNTER — Ambulatory Visit: Payer: BLUE CROSS/BLUE SHIELD | Admitting: Family Medicine

## 2015-03-26 ENCOUNTER — Telehealth: Payer: Self-pay | Admitting: Family Medicine

## 2015-03-26 DIAGNOSIS — M25562 Pain in left knee: Secondary | ICD-10-CM

## 2015-03-26 NOTE — Telephone Encounter (Signed)
OK he has to tell us his symptoms and how long they have been occuring. Then OK to order him an xray of his left knee

## 2015-03-26 NOTE — Telephone Encounter (Signed)
Caller name:Felicia  Relationship to patient: patient Can be reached:[phone] Pharmacy:  Reason for call:patient has an order for x ray of shoulder and right knee he would like to have his left knee x rayed as well

## 2015-03-26 NOTE — Telephone Encounter (Signed)
Patient stated left knee has hurt on and off for a couple years, causes him to limp at times, tender  And pressure if he tried to run (which he does not run). Informed would go ahead to order so he can do along with other xrays.

## 2015-04-13 ENCOUNTER — Ambulatory Visit (INDEPENDENT_AMBULATORY_CARE_PROVIDER_SITE_OTHER): Payer: BLUE CROSS/BLUE SHIELD | Admitting: Family Medicine

## 2015-04-13 ENCOUNTER — Encounter: Payer: Self-pay | Admitting: Family Medicine

## 2015-04-13 VITALS — BP 130/90 | HR 89 | Ht 65.0 in | Wt 147.0 lb

## 2015-04-13 DIAGNOSIS — M25511 Pain in right shoulder: Secondary | ICD-10-CM | POA: Diagnosis not present

## 2015-04-13 DIAGNOSIS — M25561 Pain in right knee: Secondary | ICD-10-CM

## 2015-04-13 DIAGNOSIS — M25562 Pain in left knee: Secondary | ICD-10-CM

## 2015-04-13 NOTE — Patient Instructions (Signed)
Your knee pain is due to arthritis. These are the four different medicines you can take for this: Tylenol 500mg  1-2 tabs three times a day for pain. Aleve 1-2 tabs twice a day with food Glucosamine sulfate 750mg  twice a day is a supplement that may help. Capsaicin, aspercreme, or biofreeze topically up to four times a day may also help with pain. Cortisone injections are an option. If cortisone injections do not help, there are different types of shots that may help but they take longer to take effect. It's important that you continue to stay active. Straight leg raises, knee extensions 3 sets of 10 once a day (add ankle weight if these become too easy). Consider physical therapy to strengthen muscles around the joint that hurts to take pressure off of the joint itself. Shoe inserts with good arch support may be helpful. Heat or ice 15 minutes at a time 3-4 times a day as needed to help with pain. Water aerobics and cycling with low resistance are the best two types of exercise for arthritis.  Your shoulder pain was initially from a rotator cuff partial tear, now with weakness and impingement. Try to avoid painful activities (overhead activities, lifting with extended arm) as much as possible. Tylenol, aleve as noted above. Subacromial injection may be beneficial to help with pain and to decrease inflammation. Consider physical therapy with transition to home exercise program. Do home exercise program with theraband and scapular stabilization exercises daily - these are very important for long term relief even if an injection was given. 3 sets of 10 once a day for next 6 weeks. If not improving at follow-up we will consider further imaging, injection, physical therapy, and/or nitro patches. Follow up with me in 6 weeks.

## 2015-04-17 NOTE — Assessment & Plan Note (Signed)
2/2 DJD.  Discussed tylenol, nsaids, glucosamine, topical medications.  Will consider cortisone injections.  Shown home exercises to do daily.  Consider PT, inserts, heat/ice.

## 2015-04-17 NOTE — Assessment & Plan Note (Signed)
remote injury - likely had a rotator cuff strain or partial tear that he never rehabilitated or recovered from.  Also with impingement symptoms.  Shown home exercises to do daily.  Tylenol, nsaids if needed.  Consider injection, PT, imaging, nitro patches.  F/u in 6 weeks.
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
## 2015-04-24 ENCOUNTER — Other Ambulatory Visit: Payer: Self-pay | Admitting: Family Medicine

## 2015-04-24 MED ORDER — ZYRTEC-D ALLERGY & CONGESTION 5-120 MG PO TB12: ORAL_TABLET | ORAL | Status: DC

## 2015-05-21 ENCOUNTER — Telehealth: Payer: Self-pay

## 2015-05-21 NOTE — Telephone Encounter (Signed)
Nasal spray needs to be changed Pharmacy only has Flonase quanitity 3x 120 meterd sprays bottle bottom of package reads 3 bottles /.054oz (15.20ml each) 1.62 fluid oz 47.4 ml total. Please call patient to discuss medication refill strength [phone] if you have any other questions

## 2015-05-21 NOTE — Telephone Encounter (Signed)
OK to refill the Flonase as requested by patient

## 2015-05-22 MED ORDER — FLUTICASONE PROPIONATE 50 MCG/ACT NA SUSP: 2.0000 | Freq: Every day | NASAL | Status: DC

## 2015-05-22 NOTE — Telephone Encounter (Signed)
Sent in as patient/PCP instructed to [facility].  Patient informed

## 2015-05-24 ENCOUNTER — Ambulatory Visit: Payer: BLUE CROSS/BLUE SHIELD | Admitting: Family Medicine

## 2015-06-26 ENCOUNTER — Other Ambulatory Visit: Payer: Self-pay | Admitting: Family Medicine

## 2015-06-26 MED ORDER — CAMPHOR-MENTHOL-METHYL SAL 1.2-5.7-6.3 % EX PTCH: 1.0000 | MEDICATED_PATCH | Freq: Two times a day (BID) | CUTANEOUS | Status: DC

## 2015-07-20 ENCOUNTER — Telehealth: Payer: Self-pay | Admitting: Family Medicine

## 2015-07-20 MED ORDER — CAMPHOR-MENTHOL-METHYL SAL 1.2-5.7-6.3 % EX PTCH: 1.0000 | MEDICATED_PATCH | Freq: Two times a day (BID) | CUTANEOUS | Status: DC

## 2015-07-20 NOTE — Telephone Encounter (Signed)
Refill done.  

## 2015-07-20 NOTE — Telephone Encounter (Signed)
Pt requesting refills on salon pas patches. He would like 5 or so refills if possible.  Pharmacy: [facility]

## 2015-08-08 ENCOUNTER — Ambulatory Visit: Payer: BLUE CROSS/BLUE SHIELD

## 2015-09-21 MED FILL — SALONPAS GEL-PATCH HOT: 0.025-1.25 | 3 days supply | Qty: 6 | Fill #1

## 2015-09-26 ENCOUNTER — Other Ambulatory Visit: Payer: Self-pay | Admitting: Family Medicine

## 2015-10-03 ENCOUNTER — Telehealth: Payer: Self-pay | Admitting: Family Medicine

## 2015-10-03 MED ORDER — CAMPHOR-MENTHOL-METHYL SAL 1.2-5.7-6.3 % EX PTCH: 1.0000 | MEDICATED_PATCH | Freq: Two times a day (BID) | CUTANEOUS | Status: DC

## 2015-10-03 NOTE — Telephone Encounter (Signed)
Spoke with Suezanne Jacquet at [facility]; there is No denial, he has never been denied; just needed a Rx refill [last Rx 07/20/15 #6 with 1 refill] sent to the pharmacy. Done/SLS 03/01

## 2015-10-03 NOTE — Telephone Encounter (Signed)
Ivin Booty, will check to see if there was a PA needed.

## 2015-10-03 NOTE — Telephone Encounter (Signed)
Caller name: Self Relationship to patient: Can be reached: [phone] Pharmacy:  Reason for call: Wants to know why he was denied Camphor-Menthol-Methyl Sal (SALONPAS) 1.2-5.7-6.3 % [place] [id number]

## 2015-10-10 ENCOUNTER — Telehealth: Payer: Self-pay | Admitting: Family Medicine

## 2015-10-10 MED FILL — SALONPAS GEL-PATCH HOT: 0.025-1.25 | 6 days supply | Qty: 12 | Fill #0

## 2015-10-10 NOTE — Telephone Encounter (Signed)
Caller name: Relationship to patient: Can be reached: Pharmacy:  Reason for call: Pain relief patch

## 2015-10-10 NOTE — Telephone Encounter (Signed)
Pt called back in to check the status of Rx. Informed pt of the below. ALSO, called pharmacy for pt to confirm. Pharmacy confirmed with pt.

## 2015-10-12 ENCOUNTER — Other Ambulatory Visit: Payer: Self-pay | Admitting: Physician Assistant

## 2015-10-13 NOTE — Telephone Encounter (Signed)
Will defer further refills of patient's medications to PCP  

## 2015-10-23 ENCOUNTER — Telehealth: Payer: Self-pay | Admitting: Family Medicine

## 2015-10-23 DIAGNOSIS — Z0000 Encounter for general adult medical examination without abnormal findings: Secondary | ICD-10-CM

## 2015-10-23 MED ORDER — SALINE 0.9 % NA AERS: 2.0000 | INHALATION_SPRAY | Freq: Two times a day (BID) | NASAL | Status: DC

## 2015-10-23 MED ORDER — IBUPROFEN 200 MG PO TABS: 200.0000 mg | ORAL_TABLET | Freq: Three times a day (TID) | ORAL | Status: DC | PRN

## 2015-10-23 NOTE — Telephone Encounter (Signed)
Relation to [id number] Call back number:[phone] Pharmacy: [place], [place] [phone] (Phone) [phone] (Fax)         Reason for call:  Patient requesting a refill ibuprofen (ADVIL,MOTRIN) 200 MG tablet and Saline 0.9 % AERS

## 2015-10-23 NOTE — Telephone Encounter (Signed)
Refills done.

## 2015-10-31 ENCOUNTER — Telehealth: Payer: Self-pay | Admitting: Family Medicine

## 2015-10-31 DIAGNOSIS — Z0000 Encounter for general adult medical examination without abnormal findings: Secondary | ICD-10-CM

## 2015-10-31 NOTE — Telephone Encounter (Signed)
Caller name: Self  Can be reached: [phone]  Pharmacy:  [place], [place] - [place] [phone] (Phone) [phone] (Fax)         Reason for call: Patient states that his Aleve was denied and he would like to know why and get a new rx for it.

## 2015-11-01 MED ORDER — NAPROXEN SODIUM 220 MG PO TABS: ORAL_TABLET | ORAL | Status: DC

## 2015-11-01 MED ORDER — KRILL OIL 500 MG PO CAPS: 500.0000 mg | ORAL_CAPSULE | Freq: Every day | ORAL | Status: DC

## 2015-11-01 MED ORDER — SALINE 0.9 % NA AERS: 1.0000 | INHALATION_SPRAY | Freq: Every day | NASAL | Status: DC

## 2015-11-01 NOTE — Telephone Encounter (Signed)
Sent in saline nasal spray once received fax from [facility] with specifics as is on their shelf.

## 2015-11-01 NOTE — Addendum Note (Signed)
Addended by: Sharon Seller B on: 11/01/2015 11:03 AM   Modules accepted: Orders, Medications

## 2015-11-01 NOTE — Telephone Encounter (Signed)
Sent in as requested 

## 2015-11-19 ENCOUNTER — Other Ambulatory Visit: Payer: Self-pay | Admitting: Family Medicine

## 2015-11-19 MED ORDER — ZYRTEC-D ALLERGY & CONGESTION 5-120 MG PO TB12: ORAL_TABLET | ORAL | Status: DC

## 2015-12-18 ENCOUNTER — Other Ambulatory Visit: Payer: Self-pay | Admitting: Family Medicine

## 2016-01-02 ENCOUNTER — Telehealth: Payer: Self-pay | Admitting: Family Medicine

## 2016-01-02 NOTE — Telephone Encounter (Signed)
Relation to [id number] Call back number:[phone] Pharmacy:  Reason for call:  Patient scheduled physical appointment for 02/18/16 at 8am and pre visit labs for 02/11/16 patient would like orders for PSA. Please advise

## 2016-01-03 ENCOUNTER — Other Ambulatory Visit: Payer: Self-pay | Admitting: Family Medicine

## 2016-01-03 DIAGNOSIS — E785 Hyperlipidemia, unspecified: Secondary | ICD-10-CM

## 2016-01-03 DIAGNOSIS — C61 Malignant neoplasm of prostate: Secondary | ICD-10-CM

## 2016-01-03 NOTE — Telephone Encounter (Signed)
Labs ordered.

## 2016-01-03 NOTE — Telephone Encounter (Signed)
Fine to order physical labs (lipid, cbc, tsh, cmp) and add a medicare PSA ( I believe he is on Medicare).

## 2016-01-04 ENCOUNTER — Other Ambulatory Visit: Payer: Self-pay | Admitting: Family Medicine

## 2016-01-21 ENCOUNTER — Telehealth: Payer: Self-pay | Admitting: Family Medicine

## 2016-01-21 NOTE — Telephone Encounter (Signed)
°  Relation to [id number] Call back number:[phone] Pharmacy: [place], [place] - [place] [phone] (Phone) [phone] (Fax)         Reason for call:  Brand name only patient requesting bismuth subsalicylate (PEPTO BISMOL) 262 MG/15ML suspension

## 2016-01-23 ENCOUNTER — Other Ambulatory Visit: Payer: Self-pay

## 2016-01-23 MED ORDER — BISMATROL 262 MG/15ML PO SUSP: 30.0000 mL | Freq: Three times a day (TID) | ORAL | Status: DC | PRN

## 2016-01-23 NOTE — Telephone Encounter (Signed)
Medication ordered per provider request.

## 2016-01-23 NOTE — Telephone Encounter (Signed)
Please resend the Pepto Bismol in his med list as DAW and change sig to 30 ml po tid prn. Disp 300 ml with 3 rf

## 2016-02-11 ENCOUNTER — Other Ambulatory Visit: Payer: BLUE CROSS/BLUE SHIELD

## 2016-02-12 ENCOUNTER — Encounter: Payer: BLUE CROSS/BLUE SHIELD | Admitting: Family Medicine

## 2016-02-18 ENCOUNTER — Telehealth: Payer: Self-pay

## 2016-02-18 ENCOUNTER — Encounter: Payer: BLUE CROSS/BLUE SHIELD | Admitting: Family Medicine

## 2016-02-18 MED ORDER — ZYRTEC-D ALLERGY & CONGESTION 5-120 MG PO TB12: ORAL_TABLET | ORAL | Status: DC

## 2016-02-18 NOTE — Telephone Encounter (Signed)
Ok to give one month supply of all in PCP absence. Further refills will need to come from her.

## 2016-02-18 NOTE — Telephone Encounter (Signed)
Pt came in the office for a CPE on 02/18/16 and it was rescheduled for a later date. The patient was requesting a refill on his Tylenol 650 mg and it was last filled for 90 tablets with 11 refills on 07/03/14.  Pt also stated that he would like the name brand of Pepto Bismol and not the generic. Please advise on refill for both to [facility].     Pt also would like a refill on the Salonpas patch and he would like it sent downstairs to our pharmacy.

## 2016-02-19 MED ORDER — CAMPHOR-MENTHOL-METHYL SAL 1.2-5.7-6.3 % EX PTCH: 1.0000 | MEDICATED_PATCH | Freq: Two times a day (BID) | CUTANEOUS | Status: DC

## 2016-02-19 NOTE — Telephone Encounter (Signed)
Rx filled that did not have any refills per Elyn Aquas who was filling in for Dr. Charlett Blake.

## 2016-03-10 ENCOUNTER — Encounter: Payer: BLUE CROSS/BLUE SHIELD | Admitting: Family Medicine

## 2016-03-31 ENCOUNTER — Telehealth: Payer: Self-pay | Admitting: Family Medicine

## 2016-03-31 ENCOUNTER — Encounter: Payer: Self-pay | Admitting: Family Medicine

## 2016-03-31 ENCOUNTER — Ambulatory Visit (INDEPENDENT_AMBULATORY_CARE_PROVIDER_SITE_OTHER): Payer: BLUE CROSS/BLUE SHIELD | Admitting: Family Medicine

## 2016-03-31 VITALS — BP 122/88 | HR 76 | Temp 97.6°F | Ht 65.0 in | Wt 150.1 lb

## 2016-03-31 DIAGNOSIS — Z0000 Encounter for general adult medical examination without abnormal findings: Secondary | ICD-10-CM

## 2016-03-31 DIAGNOSIS — E785 Hyperlipidemia, unspecified: Secondary | ICD-10-CM

## 2016-03-31 DIAGNOSIS — E782 Mixed hyperlipidemia: Secondary | ICD-10-CM | POA: Diagnosis not present

## 2016-03-31 DIAGNOSIS — Z23 Encounter for immunization: Secondary | ICD-10-CM

## 2016-03-31 DIAGNOSIS — Z8546 Personal history of malignant neoplasm of prostate: Secondary | ICD-10-CM | POA: Diagnosis not present

## 2016-03-31 LAB — CBC
HEMATOCRIT: 46.2 % (ref 39.0–52.0)
HEMOGLOBIN: 15.8 g/dL (ref 13.0–17.0)
MCHC: 34.1 g/dL (ref 30.0–36.0)
MCV: 89.8 fl (ref 78.0–100.0)
PLATELETS: 222 10*3/uL (ref 150.0–400.0)
RBC: 5.15 Mil/uL (ref 4.22–5.81)
RDW: 12.5 % (ref 11.5–15.5)
WBC: 6.4 10*3/uL (ref 4.0–10.5)

## 2016-03-31 LAB — COMPREHENSIVE METABOLIC PANEL
ALBUMIN: 4.2 g/dL (ref 3.5–5.2)
ALT: 16 U/L (ref 0–53)
AST: 17 U/L (ref 0–37)
Alkaline Phosphatase: 52 U/L (ref 39–117)
BUN: 12 mg/dL (ref 6–23)
CHLORIDE: 103 meq/L (ref 96–112)
CO2: 30 meq/L (ref 19–32)
CREATININE: 0.9 mg/dL (ref 0.40–1.50)
Calcium: 9 mg/dL (ref 8.4–10.5)
GFR: 112.51 mL/min (ref >60.00)
GLUCOSE: 92 mg/dL (ref 70–99)
Potassium: 4.1 mEq/L (ref 3.5–5.1)
SODIUM: 139 meq/L (ref 135–145)
Total Bilirubin: 0.5 mg/dL (ref 0.2–1.2)
Total Protein: 6.7 g/dL (ref 6.0–8.3)

## 2016-03-31 LAB — LIPID PANEL
CHOL/HDL RATIO: 4
CHOLESTEROL: 188 mg/dL (ref 0–200)
HDL: 45.9 mg/dL (ref >39.00)
LDL CALC: 125 mg/dL — AB (ref 0–99)
NONHDL: 142.5
Triglycerides: 88 mg/dL (ref 0.0–149.0)
VLDL: 17.6 mg/dL (ref 0.0–40.0)

## 2016-03-31 LAB — TSH: TSH: 1.17 u[IU]/mL (ref 0.35–4.50)

## 2016-03-31 LAB — HEPATITIS C ANTIBODY: HCV Ab: NEGATIVE

## 2016-03-31 LAB — PSA: PSA: 0.01 ng/mL — ABNORMAL LOW (ref 0.10–4.00)

## 2016-03-31 MED ORDER — ACETAMINOPHEN 500 MG PO TABS: 500.0000 mg | ORAL_TABLET | Freq: Three times a day (TID) | ORAL | 5 refills | Status: DC | PRN

## 2016-03-31 NOTE — Progress Notes (Signed)
Patient ID: John Lane, male   DOB: May 06, 1961, 55 y.o.   MRN: [id number]   Subjective:    Patient ID: John Lane, male    DOB: [birth year], 55 y.o.   MRN: [id number]  Chief Complaint  Patient presents with  . Annual Exam    HPI Patient is in today for annual physical exam and follow up on health concerns. He feels well. No recent illness or acute concerns. Has continued to work, eat a heart healthy diet and stay physically active. Denies CP/palp/SOB/HA/congestion/fevers/GI or GU c/o. Taking meds as prescribed  Past Medical History:  Diagnosis Date  . Allergic state 02/11/2015  . Allergy   . H/O hematuria    2003  . H/O prostate cancer 08/28/2011  . Other and unspecified hyperlipidemia 01/18/2014  . Prostate cancer ([place]) 12/24/11   Gleason 3+4=7, volume 15 gm    Past Surgical History:  Procedure Laterality Date  . APPENDECTOMY    . CIRCUMCISION  05/12/2012   Procedure: CIRCUMCISION ADULT;  Surgeon: Bernestine Amass, MD;  Location: [facility];  Service: Urology;  Laterality: N/A;  . ROBOT ASSISTED LAPAROSCOPIC RADICAL PROSTATECTOMY  05/12/2012   Procedure: ROBOTIC ASSISTED LAPAROSCOPIC RADICAL PROSTATECTOMY;  Surgeon: Bernestine Amass, MD;  Location: [facility];  Service: Urology;  Laterality: N/A;  . TONSILLECTOMY      Family History  Problem Relation Age of Onset  . Diabetes Father   . Hypertension Father   . Heart disease Father   . Arthritis Father   . Hypertension Mother   . Stroke Mother 74    left arm weakness, speech improving,   . Gout Mother   . Arthritis Sister   . Arthritis Sister   . Cancer Sister   . Hypertension Brother   . Hearing loss Brother   . Hearing loss Son   . Heart disease Brother     MI  . Hypertension Brother     Social History   Social History  . Marital status: Married    Spouse name: N/A  . Number of children: N/A  . Years of education: N/A   Occupational History  . Not on file.   Social History Main Topics  . Smoking status: Former  Smoker    Packs/day: 0.50    Years: 10.00    Quit date: 08/05/1991  . Smokeless tobacco: Never Used  . Alcohol use 1.2 oz/week    2 Standard drinks or equivalent per week     Comment: 1-2 weekly  . Drug use: No  . 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 Not on file   Social History Narrative   Married   Former smoker   Quit over 18 years ago-smoked for 10 years 1/2 ppd   3 children    Outpatient Medications Prior to Visit  Medication Sig Dispense Refill  . acetaminophen (TYLENOL) 650 MG CR tablet Take 1 tablet (650 mg total) by mouth every 6 (six) hours as needed for pain. Max of 3000 mg tylenol in 24 hours 90 tablet 11  . BAYER ASPIRIN EC LOW DOSE 81 MG EC tablet TAKE ONE TABLET BY MOUTH ONCE DAILY AS NEEDED FOR PAIN 400 tablet 0  . BISMATROL 262 MG/15ML suspension Take 30 mLs by mouth 3 (three) times daily as needed. 300 mL 3  . budesonide (RHINOCORT AQUA) 32 MCG/ACT nasal spray Place 1  spray into both nostrils daily. 8.6 g 5  . Camphor-Menthol-Methyl Sal 1.2-5.7-6.3 % PTCH Apply 1 patch topically 2 (two) times daily. 6 patch 0  . fluticasone (FLONASE) 50 MCG/ACT nasal spray Place 2 sprays into both nostrils daily. 47.4 g 3  . ibuprofen (ADVIL,MOTRIN) 200 MG tablet Take 1 tablet (200 mg total) by mouth 3 (three) times daily as needed. Advil Liqui-gel 240 tablet 0  . Krill Oil 500 MG CAPS Take 1 capsule (500 mg total) by mouth daily. 160 capsule 6  . Multiple Vitamins-Minerals (AIRBORNE) CHEW Use as directed by package for 7 days 30 tablet 1  . naproxen sodium (ANAPROX) 220 MG tablet TAKE ONE CAPSULE BY MOUTH TWICE DAILY AS NEEDED 160 tablet 6  . olopatadine (PATANOL) 0.1 % ophthalmic solution Place 1 drop into both eyes 2 (two) times daily as needed. 5 mL 1  . Saline (SIMPLY SALINE) 0.9 % AERS Place 1 spray into the nose daily. 378 mL 2  . sildenafil (VIAGRA) 100 MG tablet  Take 0.5-1 tablets (50-100 mg total) by mouth daily as needed for erectile dysfunction. 9 tablet 3  . triamcinolone (NASACORT ALLERGY 24HR) 55 MCG/ACT AERO nasal inhaler Place 2 sprays into the nose daily. 1 Inhaler 12  . Turmeric Curcumin 500 MG CAPS Take 500 mg by mouth daily. 250 capsule 1  . ZYRTEC-D ALLERGY & CONGESTION 5-120 MG tablet TAKE ONE TABLET BY MOUTH ONCE DAILY AS NEEDED FOR ALLERGY 24 tablet 0   No facility-administered medications prior to visit.     No Known Allergies  Review of Systems  Constitutional: Negative for chills, fever and malaise/fatigue.  HENT: Negative for congestion and hearing loss.   Eyes: Negative for discharge.  Respiratory: Negative for cough, sputum production and shortness of breath.   Cardiovascular: Negative for chest pain, palpitations and leg swelling.  Gastrointestinal: Negative for abdominal pain, blood in stool, constipation, diarrhea, heartburn, nausea and vomiting.  Genitourinary: Negative for dysuria, frequency, hematuria and urgency.  Musculoskeletal: Negative for back pain, falls and myalgias.  Skin: Negative for rash.  Neurological: Negative for dizziness, sensory change, loss of consciousness, weakness and headaches.  Endo/Heme/Allergies: Negative for environmental allergies. Does not bruise/bleed easily.  Psychiatric/Behavioral: Negative for depression and suicidal ideas. The patient is not nervous/anxious and does not have insomnia.        Objective:    Physical Exam  Constitutional: He is oriented to person, place, and time. He appears well-developed and well-nourished. No distress.  HENT:  Head: Normocephalic and atraumatic.  Eyes: Conjunctivae are normal.  Neck: Neck supple. No thyromegaly present.  Cardiovascular: Normal rate, regular rhythm and normal heart sounds.   No murmur heard. Pulmonary/Chest: Effort normal and breath sounds normal. No respiratory distress. He has no wheezes.  Abdominal: Soft. Bowel sounds are  normal. He exhibits no mass. There is no tenderness.  Musculoskeletal: He exhibits no edema.  Lymphadenopathy:    He has no cervical adenopathy.  Neurological: He is alert and oriented to person, place, and time.  Skin: Skin is warm and dry.  Psychiatric: He has a normal mood and affect. His behavior is normal.    BP 122/88 (BP Location: Right Arm, Patient Position: Sitting, Cuff Size: Normal)   Pulse 76   Temp 97.6 F (36.4 C) (Oral)   Ht 5' 5"  (1.651 m)   Wt 150 lb 2 oz (68.1 kg)   SpO2 94%   BMI 24.98 kg/m  Wt Readings from Last 3 Encounters:  03/31/16 150 lb 2  oz (68.1 kg)  04/13/15 147 lb (66.7 kg)  02/06/15 149 lb 6 oz (67.8 kg)     Lab Results  Component Value Date   WBC 6.2 02/06/2015   HGB 15.2 02/06/2015   HCT 45.1 02/06/2015   PLT 220.0 02/06/2015   GLUCOSE 76 02/06/2015   CHOL 174 02/06/2015   TRIG 221.0 (H) 02/06/2015   HDL 45.60 02/06/2015   LDLDIRECT 99.0 02/06/2015   LDLCALC 115 (H) 11/15/2013   ALT 15 02/06/2015   AST 19 02/06/2015   NA 138 02/06/2015   K 4.4 02/06/2015   CL 101 02/06/2015   CREATININE 1.32 02/06/2015   BUN 15 02/06/2015   CO2 30 02/06/2015   TSH 1.29 02/06/2015   PSA <0.01 11/15/2013   MICROALBUR 1.3 04/14/2008    Lab Results  Component Value Date   TSH 1.29 02/06/2015   Lab Results  Component Value Date   WBC 6.2 02/06/2015   HGB 15.2 02/06/2015   HCT 45.1 02/06/2015   MCV 91.2 02/06/2015   PLT 220.0 02/06/2015   Lab Results  Component Value Date   NA 138 02/06/2015   K 4.4 02/06/2015   CO2 30 02/06/2015   GLUCOSE 76 02/06/2015   BUN 15 02/06/2015   CREATININE 1.32 02/06/2015   BILITOT 0.5 02/06/2015   ALKPHOS 46 02/06/2015   AST 19 02/06/2015   ALT 15 02/06/2015   PROT 7.0 02/06/2015   ALBUMIN 4.0 02/06/2015   CALCIUM 9.2 02/06/2015   GFR 72.62 02/06/2015   Lab Results  Component Value Date   CHOL 174 02/06/2015   Lab Results  Component Value Date   HDL 45.60 02/06/2015   Lab Results    Component Value Date   LDLCALC 115 (H) 11/15/2013   Lab Results  Component Value Date   TRIG 221.0 (H) 02/06/2015   Lab Results  Component Value Date   CHOLHDL 4 02/06/2015   No results found for: HGBA1C     Assessment & Plan:   Problem List Items Addressed This Visit    Annual physical exam    Patient encouraged to maintain heart healthy diet, regular exercise, adequate sleep. . Consider daily probiotics. Take medications as prescribed. Given flu shot today. Completed forms for [employer]       H/O prostate cancer    Has been following with Dr Francina Ames at [facility], gets PSA every 6 months, requests PSA be done here today. Will request last labs       Hyperlipidemia    Encouraged heart healthy diet, increase exercise, avoid trans fats, consider a krill oil cap daily       Other Visit Diagnoses    Preventative health care    -  Primary   Relevant Orders   Lipid panel   Comp Met (CMET)   Hepatitis C Antibody   TSH   CBC   PSA   Hyperlipidemia, mixed       Relevant Orders   Lipid panel      I am having Mr. Wolters maintain his sildenafil, triamcinolone, acetaminophen, AIRBORNE, budesonide, olopatadine, Turmeric Curcumin, fluticasone, BAYER ASPIRIN EC LOW DOSE, ibuprofen, naproxen sodium, Krill Oil, Saline, BISMATROL, ZYRTEC-D ALLERGY & CONGESTION, and Camphor-Menthol-Methyl Sal.  No orders of the defined types were placed in this encounter.    Penni Homans, MD

## 2016-03-31 NOTE — Telephone Encounter (Signed)
Pt is request a Rx for pepto bismol. He says that he has to have a Rx for it so that he can purchase with his [employer].     Pharmacy: [place], [place] - [address]

## 2016-03-31 NOTE — Assessment & Plan Note (Signed)
Has been following with Dr Francina Ames at [facility], gets PSA every 6 months, requests PSA be done here today. Will request last labs

## 2016-03-31 NOTE — Assessment & Plan Note (Signed)
Encouraged heart healthy diet, increase exercise, avoid trans fats, consider a krill oil cap daily 

## 2016-03-31 NOTE — Assessment & Plan Note (Signed)
Patient encouraged to maintain heart healthy diet, regular exercise, adequate sleep. . Consider daily probiotics. Take medications as prescribed. Given flu shot today. Completed forms for [employer]

## 2016-03-31 NOTE — Telephone Encounter (Signed)
He is also wanting specifically Tylenol Rapid Release Gel Capsules (brand name only)

## 2016-03-31 NOTE — Progress Notes (Signed)
Pre visit review using our clinic review tool, if applicable. No additional management support is needed unless otherwise documented below in the visit note. 

## 2016-03-31 NOTE — Telephone Encounter (Signed)
OK to write him a prescription for the tylenol gel caps he is requesting, just clarify withhim is it a 500 mg dose he wants or a different dose? Can write sig 1 tab po tid prn pain, disp # 1 bottle with 5 rf

## 2016-03-31 NOTE — Telephone Encounter (Signed)
Tylenol sent in as instructed/patient wanted.

## 2016-03-31 NOTE — Patient Instructions (Signed)
Consider giving [place] a copy of HCP, living will  Preventive Care for Adults, Male A healthy lifestyle and preventive care can promote health and wellness. Preventive health guidelines for men include the following key practices:  A routine yearly physical is a good way to check with your health care provider about your health and preventative screening. It is a chance to share any concerns and updates on your health and to receive a thorough exam.  Visit your dentist for a routine exam and preventative care every 6 months. Brush your teeth twice a day and floss once a day. Good oral hygiene prevents tooth decay and gum disease.  The frequency of eye exams is based on your age, health, family medical history, use of contact lenses, and other factors. Follow your health care provider's recommendations for frequency of eye exams.  Eat a healthy diet. Foods such as vegetables, fruits, whole grains, low-fat dairy products, and lean protein foods contain the nutrients you need without too many calories. Decrease your intake of foods high in solid fats, added sugars, and salt. Eat the right amount of calories for you.Get information about a proper diet from your health care provider, if necessary.  Regular physical exercise is one of the most important things you can do for your health. Most adults should get at least 150 minutes of moderate-intensity exercise (any activity that increases your heart rate and causes you to sweat) each week. In addition, most adults need muscle-strengthening exercises on 2 or more days a week.  Maintain a healthy weight. The body mass index (BMI) is a screening tool to identify possible weight problems. It provides an estimate of body fat based on height and weight. Your health care provider can find your BMI and can help you achieve or maintain a healthy weight.For adults 20 years and older:  A BMI below 18.5 is considered underweight.  A BMI of 18.5 to 24.9 is  normal.  A BMI of 25 to 29.9 is considered overweight.  A BMI of 30 and above is considered obese.  Maintain normal blood lipids and cholesterol levels by exercising and minimizing your intake of saturated fat. Eat a balanced diet with plenty of fruit and vegetables. Blood tests for lipids and cholesterol should begin at age 24 and be repeated every 5 years. If your lipid or cholesterol levels are high, you are over 50, or you are at high risk for heart disease, you may need your cholesterol levels checked more frequently.Ongoing high lipid and cholesterol levels should be treated with medicines if diet and exercise are not working.  If you smoke, find out from your health care provider how to quit. If you do not use tobacco, do not start.  Lung cancer screening is recommended for adults aged 32-80 years who are at high risk for developing lung cancer because of a history of smoking. A yearly low-dose CT scan of the lungs is recommended for people who have at least a 30-pack-year history of smoking and are a current smoker or have quit within the past 15 years. A pack year of smoking is smoking an average of 1 pack of cigarettes a day for 1 year (for example: 1 pack a day for 30 years or 2 packs a day for 15 years). Yearly screening should continue until the smoker has stopped smoking for at least 15 years. Yearly screening should be stopped for people who develop a health problem that would prevent them from having lung cancer treatment.  If you choose to drink alcohol, do not have more than 2 drinks per day. One drink is considered to be 12 ounces (355 mL) of beer, 5 ounces (148 mL) of wine, or 1.5 ounces (44 mL) of liquor.  Avoid use of street drugs. Do not share needles with anyone. Ask for help if you need support or instructions about stopping the use of drugs.  High blood pressure causes heart disease and increases the risk of stroke. Your blood pressure should be checked at least every 1-2  years. Ongoing high blood pressure should be treated with medicines, if weight loss and exercise are not effective.  If you are 45-65 years old, ask your health care provider if you should take aspirin to prevent heart disease.  Diabetes screening is done by taking a blood sample to check your blood glucose level after you have not eaten for a certain period of time (fasting). If you are not overweight and you do not have risk factors for diabetes, you should be screened once every 3 years starting at age 24. If you are overweight or obese and you are 1-64 years of age, you should be screened for diabetes every year as part of your cardiovascular risk assessment.  Colorectal cancer can be detected and often prevented. Most routine colorectal cancer screening begins at the age of 72 and continues through age 30. However, your health care provider may recommend screening at an earlier age if you have risk factors for colon cancer. On a yearly basis, your health care provider may provide home test kits to check for hidden blood in the stool. Use of a small camera at the end of a tube to directly examine the colon (sigmoidoscopy or colonoscopy) can detect the earliest forms of colorectal cancer. Talk to your health care provider about this at age 81, when routine screening begins. Direct exam of the colon should be repeated every 5-10 years through age 62, unless early forms of precancerous polyps or small growths are found.  People who are at an increased risk for hepatitis B should be screened for this virus. You are considered at high risk for hepatitis B if:  You were born in a country where hepatitis B occurs often. Talk with your health care provider about which countries are considered high risk.  Your parents were born in a high-risk country and you have not received a shot to protect against hepatitis B (hepatitis B vaccine).  You have HIV or AIDS.  You use needles to inject street  drugs.  You live with, or have sex with, someone who has hepatitis B.  You are a man who has sex with other men (MSM).  You get hemodialysis treatment.  You take certain medicines for conditions such as cancer, organ transplantation, and autoimmune conditions.  Hepatitis C blood testing is recommended for all people born from [birth year] through 1965 and any individual with known risks for hepatitis C.  Practice safe sex. Use condoms and avoid high-risk sexual practices to reduce the spread of sexually transmitted infections (STIs). STIs include gonorrhea, chlamydia, syphilis, trichomonas, herpes, HPV, and human immunodeficiency virus (HIV). Herpes, HIV, and HPV are viral illnesses that have no cure. They can result in disability, cancer, and death.  If you are a man who has sex with other men, you should be screened at least once per year for:  HIV.  Urethral, rectal, and pharyngeal infection of gonorrhea, chlamydia, or both.  If you are at risk of being  infected with HIV, it is recommended that you take a prescription medicine daily to prevent HIV infection. This is called preexposure prophylaxis (PrEP). You are considered at risk if:  You are a man who has sex with other men (MSM) and have other risk factors.  You are a heterosexual man, are sexually active, and are at increased risk for HIV infection.  You take drugs by injection.  You are sexually active with a partner who has HIV.  Talk with your health care provider about whether you are at high risk of being infected with HIV. If you choose to begin PrEP, you should first be tested for HIV. You should then be tested every 3 months for as long as you are taking PrEP.  A one-time screening for abdominal aortic aneurysm (AAA) and surgical repair of large AAAs by ultrasound are recommended for men ages 65 to 75 years who are current or former smokers.  Healthy men should no longer receive prostate-specific antigen (PSA) blood tests as  part of routine cancer screening. Talk with your health care provider about prostate cancer screening.  Testicular cancer screening is not recommended for adult males who have no symptoms. Screening includes self-exam, a health care provider exam, and other screening tests. Consult with your health care provider about any symptoms you have or any concerns you have about testicular cancer.  Use sunscreen. Apply sunscreen liberally and repeatedly throughout the day. You should seek shade when your shadow is shorter than you. Protect yourself by wearing long sleeves, pants, a wide-brimmed hat, and sunglasses year round, whenever you are outdoors.  Once a month, do a whole-body skin exam, using a mirror to look at the skin on your back. Tell your health care provider about new moles, moles that have irregular borders, moles that are larger than a pencil eraser, or moles that have changed in shape or color.  Stay current with required vaccines (immunizations).  Influenza vaccine. All adults should be immunized every year.  Tetanus, diphtheria, and acellular pertussis (Td, Tdap) vaccine. An adult who has not previously received Tdap or who does not know his vaccine status should receive 1 dose of Tdap. This initial dose should be followed by tetanus and diphtheria toxoids (Td) booster doses every 10 years. Adults with an unknown or incomplete history of completing a 3-dose immunization series with Td-containing vaccines should begin or complete a primary immunization series including a Tdap dose. Adults should receive a Td booster every 10 years.  Varicella vaccine. An adult without evidence of immunity to varicella should receive 2 doses or a second dose if he has previously received 1 dose.  Human papillomavirus (HPV) vaccine. Males aged 11-21 years who have not received the vaccine previously should receive the 3-dose series. Males aged 22-26 years may be immunized. Immunization is recommended through  the age of 26 years for any male who has sex with males and did not get any or all doses earlier. Immunization is recommended for any person with an immunocompromised condition through the age of 26 years if he did not get any or all doses earlier. During the 3-dose series, the second dose should be obtained 4-8 weeks after the first dose. The third dose should be obtained 24 weeks after the first dose and 16 weeks after the second dose.  Zoster vaccine. One dose is recommended for adults aged 60 years or older unless certain conditions are present.  Measles, mumps, and rubella (MMR) vaccine. Adults born before 1957 generally are   considered immune to measles and mumps. Adults born in 1957 or later should have 1 or more doses of MMR vaccine unless there is a contraindication to the vaccine or there is laboratory evidence of immunity to each of the three diseases. A routine second dose of MMR vaccine should be obtained at least 28 days after the first dose for students attending postsecondary schools, health care workers, or international travelers. People who received inactivated measles vaccine or an unknown type of measles vaccine during 1963-1967 should receive 2 doses of MMR vaccine. People who received inactivated mumps vaccine or an unknown type of mumps vaccine before 1979 and are at high risk for mumps infection should consider immunization with 2 doses of MMR vaccine. Unvaccinated health care workers born before 1957 who lack laboratory evidence of measles, mumps, or rubella immunity or laboratory confirmation of disease should consider measles and mumps immunization with 2 doses of MMR vaccine or rubella immunization with 1 dose of MMR vaccine.  Pneumococcal 13-valent conjugate (PCV13) vaccine. When indicated, a person who is uncertain of his immunization history and has no record of immunization should receive the PCV13 vaccine. All adults 65 years of age and older should receive this vaccine. An  adult aged 19 years or older who has certain medical conditions and has not been previously immunized should receive 1 dose of PCV13 vaccine. This PCV13 should be followed with a dose of pneumococcal polysaccharide (PPSV23) vaccine. Adults who are at high risk for pneumococcal disease should obtain the PPSV23 vaccine at least 8 weeks after the dose of PCV13 vaccine. Adults older than 55 years of age who have normal immune system function should obtain the PPSV23 vaccine dose at least 1 year after the dose of PCV13 vaccine.  Pneumococcal polysaccharide (PPSV23) vaccine. When PCV13 is also indicated, PCV13 should be obtained first. All adults aged 65 years and older should be immunized. An adult younger than age 65 years who has certain medical conditions should be immunized. Any person who resides in a nursing home or long-term care facility should be immunized. An adult smoker should be immunized. People with an immunocompromised condition and certain other conditions should receive both PCV13 and PPSV23 vaccines. People with human immunodeficiency virus (HIV) infection should be immunized as soon as possible after diagnosis. Immunization during chemotherapy or radiation therapy should be avoided. Routine use of PPSV23 vaccine is not recommended for American Indians, [place] Natives, or people younger than 65 years unless there are medical conditions that require PPSV23 vaccine. When indicated, people who have unknown immunization and have no record of immunization should receive PPSV23 vaccine. One-time revaccination 5 years after the first dose of PPSV23 is recommended for people aged 19-64 years who have chronic kidney failure, nephrotic syndrome, asplenia, or immunocompromised conditions. People who received 1-2 doses of PPSV23 before age 65 years should receive another dose of PPSV23 vaccine at age 65 years or later if at least 5 years have passed since the previous dose. Doses of PPSV23 are not needed for  people immunized with PPSV23 at or after age 65 years.  Meningococcal vaccine. Adults with asplenia or persistent complement component deficiencies should receive 2 doses of quadrivalent meningococcal conjugate (MenACWY-D) vaccine. The doses should be obtained at least 2 months apart. Microbiologists working with certain meningococcal bacteria, military recruits, people at risk during an outbreak, and people who travel to or live in countries with a high rate of meningitis should be immunized. A first-year college student up through age 21 years   who is living in a residence hall should receive a dose if he did not receive a dose on or after his 16th birthday. Adults who have certain high-risk conditions should receive one or more doses of vaccine.  Hepatitis A vaccine. Adults who wish to be protected from this disease, have chronic liver disease, work with hepatitis A-infected animals, work in hepatitis A research labs, or travel to or work in countries with a high rate of hepatitis A should be immunized. Adults who were previously unvaccinated and who anticipate close contact with an international adoptee during the first 60 days after arrival in the [place] from a country with a high rate of hepatitis A should be immunized.  Hepatitis B vaccine. Adults should be immunized if they wish to be protected from this disease, are under age 54 years and have diabetes, have chronic liver disease, have had more than one sex partner in the past 6 months, may be exposed to blood or other infectious body fluids, are household contacts or sex partners of hepatitis B positive people, are clients or workers in certain care facilities, or travel to or work in countries with a high rate of hepatitis B.  Haemophilus influenzae type b (Hib) vaccine. A previously unvaccinated person with asplenia or sickle cell disease or having a scheduled splenectomy should receive 1 dose of Hib vaccine. Regardless of previous  immunization, a recipient of a hematopoietic stem cell transplant should receive a 3-dose series 6-12 months after his successful transplant. Hib vaccine is not recommended for adults with HIV infection. Preventive Service / Frequency Ages 33 to 24  Blood pressure check.** / Every 3-5 years.  Lipid and cholesterol check.** / Every 5 years beginning at age 4.  Hepatitis C blood test.** / For any individual with known risks for hepatitis C.  Skin self-exam. / Monthly.  Influenza vaccine. / Every year.  Tetanus, diphtheria, and acellular pertussis (Tdap, Td) vaccine.** / Consult your health care provider. 1 dose of Td every 10 years.  Varicella vaccine.** / Consult your health care provider.  HPV vaccine. / 3 doses over 6 months, if 81 or younger.  Measles, mumps, rubella (MMR) vaccine.** / You need at least 1 dose of MMR if you were born in 1957 or later. You may also need a second dose.  Pneumococcal 13-valent conjugate (PCV13) vaccine.** / Consult your health care provider.  Pneumococcal polysaccharide (PPSV23) vaccine.** / 1 to 2 doses if you smoke cigarettes or if you have certain conditions.  Meningococcal vaccine.** / 1 dose if you are age 78 to 45 years and a Market researcher living in a residence hall, or have one of several medical conditions. You may also need additional booster doses.  Hepatitis A vaccine.** / Consult your health care provider.  Hepatitis B vaccine.** / Consult your health care provider.  Haemophilus influenzae type b (Hib) vaccine.** / Consult your health care provider. Ages 19 to 19  Blood pressure check.** / Every year.  Lipid and cholesterol check.** / Every 5 years beginning at age 50.  Lung cancer screening. / Every year if you are aged 76-80 years and have a 30-pack-year history of smoking and currently smoke or have quit within the past 15 years. Yearly screening is stopped once you have quit smoking for at least 15 years or develop  a health problem that would prevent you from having lung cancer treatment.  Fecal occult blood test (FOBT) of stool. / Every year beginning at age 32 and  continuing until age 51. You may not have to do this test if you get a colonoscopy every 10 years.  Flexible sigmoidoscopy** or colonoscopy.** / Every 5 years for a flexible sigmoidoscopy or every 10 years for a colonoscopy beginning at age 42 and continuing until age 62.  Hepatitis C blood test.** / For all people born from 18 through 1965 and any individual with known risks for hepatitis C.  Skin self-exam. / Monthly.  Influenza vaccine. / Every year.  Tetanus, diphtheria, and acellular pertussis (Tdap/Td) vaccine.** / Consult your health care provider. 1 dose of Td every 10 years.  Varicella vaccine.** / Consult your health care provider.  Zoster vaccine.** / 1 dose for adults aged 87 years or older.  Measles, mumps, rubella (MMR) vaccine.** / You need at least 1 dose of MMR if you were born in 1957 or later. You may also need a second dose.  Pneumococcal 13-valent conjugate (PCV13) vaccine.** / Consult your health care provider.  Pneumococcal polysaccharide (PPSV23) vaccine.** / 1 to 2 doses if you smoke cigarettes or if you have certain conditions.  Meningococcal vaccine.** / Consult your health care provider.  Hepatitis A vaccine.** / Consult your health care provider.  Hepatitis B vaccine.** / Consult your health care provider.  Haemophilus influenzae type b (Hib) vaccine.** / Consult your health care provider. Ages 85 and over  Blood pressure check.** / Every year.  Lipid and cholesterol check.**/ Every 5 years beginning at age 10.  Lung cancer screening. / Every year if you are aged 67-80 years and have a 30-pack-year history of smoking and currently smoke or have quit within the past 15 years. Yearly screening is stopped once you have quit smoking for at least 15 years or develop a health problem that would prevent  you from having lung cancer treatment.  Fecal occult blood test (FOBT) of stool. / Every year beginning at age 66 and continuing until age 81. You may not have to do this test if you get a colonoscopy every 10 years.  Flexible sigmoidoscopy** or colonoscopy.** / Every 5 years for a flexible sigmoidoscopy or every 10 years for a colonoscopy beginning at age 42 and continuing until age 83.  Hepatitis C blood test.** / For all people born from [birth year] through 1965 and any individual with known risks for hepatitis C.  Abdominal aortic aneurysm (AAA) screening.** / A one-time screening for ages 44 to 95 years who are current or former smokers.  Skin self-exam. / Monthly.  Influenza vaccine. / Every year.  Tetanus, diphtheria, and acellular pertussis (Tdap/Td) vaccine.** / 1 dose of Td every 10 years.  Varicella vaccine.** / Consult your health care provider.  Zoster vaccine.** / 1 dose for adults aged 73 years or older.  Pneumococcal 13-valent conjugate (PCV13) vaccine.** / 1 dose for all adults aged 67 years and older.  Pneumococcal polysaccharide (PPSV23) vaccine.** / 1 dose for all adults aged 72 years and older.  Meningococcal vaccine.** / Consult your health care provider.  Hepatitis A vaccine.** / Consult your health care provider.  Hepatitis B vaccine.** / Consult your health care provider.  Haemophilus influenzae type b (Hib) vaccine.** / Consult your health care provider. **Family history and personal history of risk and conditions may change your health care provider's recommendations.   This information is not intended to replace advice given to you by your health care provider. Make sure you discuss any questions you have with your health care provider.   Document Released: 09/16/2001 Document  Revised: 08/11/2014 Document Reviewed: 12/16/2010 Elsevier Interactive Patient Education 2016 [employer]  

## 2016-04-03 ENCOUNTER — Telehealth: Payer: Self-pay | Admitting: Family Medicine

## 2016-04-03 NOTE — Telephone Encounter (Signed)
The patient is requesting an exact amount/name due to using his benny card, but I cannot find exactly what he is needing.  Could it be written on a script pad/PCP sign.

## 2016-04-03 NOTE — Telephone Encounter (Signed)
Sure just write it on the script pad and can send it over put something similar in the [facility]

## 2016-04-03 NOTE — Telephone Encounter (Signed)
Completed.

## 2016-04-03 NOTE — Telephone Encounter (Signed)
Pt called in. He states that he did receive Rx for Tylenol but it wasn't the correct Rx. It should have been for Rapid Relief Gel Caps with a 290 count.    ALSO, pt says that he never received Rx for pepto bismol that was requested in previous message.    Pt need brand name only so that he can use his bene card.  Pt is available if any questions.

## 2016-04-04 MED ORDER — BISMUTH SUBSALICYLATE 262 MG/15ML PO SUSP: 30.0000 mL | Freq: Four times a day (QID) | ORAL | 3 refills | Status: DC | PRN

## 2016-04-04 NOTE — Telephone Encounter (Signed)
Called the patient to get specifics to write script, left message to call back

## 2016-04-04 NOTE — Telephone Encounter (Signed)
Patient will pickup [facility] to [facility]ol. Will call back with miligrams to go on prescription (patient is using his [place] card for these.)

## 2016-04-04 NOTE — Addendum Note (Signed)
Addended by: Sharon Seller B on: 04/04/2016 03:05 PM   Modules accepted: Orders

## 2016-04-04 NOTE — Telephone Encounter (Signed)
Call left msg to call back

## 2016-04-15 ENCOUNTER — Other Ambulatory Visit: Payer: Self-pay | Admitting: Family Medicine

## 2016-04-17 ENCOUNTER — Telehealth: Payer: Self-pay | Admitting: Family Medicine

## 2016-04-17 NOTE — Telephone Encounter (Signed)
°  Relationship to patient: Self  Can be reached: [phone]   Pharmacy:  [place], [place] [phone] (Phone) [phone] (Fax)    Reason for call: Patient would like a Rx for Tylenol Rapid Release Gel Caps/290 count/500mg s  And  Multiple Vitamins-Minerals ([facility] GUMMIES PO) [id number]   Would like written Rx for both. Plse call when ready for pickup

## 2016-04-17 NOTE — Telephone Encounter (Signed)
Completed all prescription per patient request. Called the patient informed by message to pickup at the front desk.

## 2016-04-17 NOTE — Telephone Encounter (Signed)
Pt returned call. Made pt aware of Rx ready for pick up.

## 2016-04-18 MED FILL — SALONPAS GEL-PATCH HOT: 0.025-1.25 | 3 days supply | Qty: 6 | Fill #0

## 2016-05-31 ENCOUNTER — Other Ambulatory Visit: Payer: Self-pay | Admitting: Family Medicine

## 2016-07-17 ENCOUNTER — Other Ambulatory Visit: Payer: Self-pay | Admitting: Family Medicine

## 2016-08-19 ENCOUNTER — Other Ambulatory Visit: Payer: Self-pay | Admitting: Family Medicine

## 2016-08-19 NOTE — Telephone Encounter (Signed)
Patient is calling to request a refill of Camphor-Menthol-Methyl Sal 1.2-5.7-6.3 % [place] Patient is requesting the name brand medication. Please advise  Pharmacy: [place], [place] [address]

## 2016-08-25 MED ORDER — CAMPHOR-MENTHOL-METHYL SAL 1.2-5.7-6.3 % EX PTCH: 1.0000 | MEDICATED_PATCH | Freq: Two times a day (BID) | CUTANEOUS | 0 refills | Status: DC

## 2016-08-25 MED FILL — SALONPAS GEL-PATCH HOT: 0.025-1.25 | 3 days supply | Qty: 6 | Fill #0

## 2016-08-29 ENCOUNTER — Telehealth: Payer: Self-pay | Admitting: Family Medicine

## 2016-08-29 MED ORDER — CAMPHOR-MENTHOL-METHYL SAL 1.2-5.7-6.3 % EX PTCH: 1.0000 | MEDICATED_PATCH | Freq: Two times a day (BID) | CUTANEOUS | 0 refills | Status: DC

## 2016-08-29 NOTE — Telephone Encounter (Signed)
Spoke to Corinna in the pharmacy down stairs. He stated he is wanting Salon Pas with lidocaine. He requested we send in as prescribed and he can change in the pharmacy for this patient.

## 2016-08-29 NOTE — Telephone Encounter (Signed)
OK to refill pain patch but do not know about the new patch with lidocaine. Please check with his pharmacist to see what patch with Lidocaine they carry and I will consider changing. Not sure who put that note on his prescription but it just means they want me to refill it directly as PCP

## 2016-08-29 NOTE — Telephone Encounter (Signed)
Patient is requesting to get Camphor-Menthol-Methyl Sal 1.2-5.7-6.3 % PTCH He states that they have one that has lidocaine in it... He would like to know if he could get this new patch this time? He is also curious as to what the note on the last prescription meant, "Notes to Pharmacy: Patient will need to get further refills from PCP." Please advise   Pharmacy: [facility], [place]

## 2016-09-03 ENCOUNTER — Encounter: Payer: Self-pay | Admitting: Gastroenterology

## 2016-11-25 ENCOUNTER — Other Ambulatory Visit: Payer: Self-pay | Admitting: Family Medicine

## 2016-12-16 ENCOUNTER — Other Ambulatory Visit: Payer: Self-pay | Admitting: Family Medicine

## 2016-12-26 ENCOUNTER — Telehealth: Payer: Self-pay | Admitting: Family Medicine

## 2016-12-26 NOTE — Telephone Encounter (Signed)
Relation to pt: self  Call back number: [phone] Pharmacy: [place], [place] - [address] [phone] (Phone) [phone] (Fax)     Reason for call:  Patient requesting a refill Camphor-Menthol-Methyl Sal 1.2-5.7-6.3 % PTCH , patient states pharmacy denied, please advis

## 2016-12-26 NOTE — Telephone Encounter (Signed)
Patient is needing this refilled and was denied from [place]. He states it is the salon pas and he wants to use his health savings card to get them. Will send to PCP to review. I spoke to the pharmacist downstairs and he states he thinks an EPIC denies this every time for some reason. I can just phone in if ok.

## 2016-12-29 NOTE — Telephone Encounter (Signed)
OK to phone in Mono patches or gel. Apply to affected area bid prn pain

## 2016-12-31 MED ORDER — CAMPHOR-MENTHOL-METHYL SAL 1.2-5.7-6.3 % EX PTCH: 1.0000 | MEDICATED_PATCH | Freq: Two times a day (BID) | CUTANEOUS | 1 refills | Status: DC

## 2016-12-31 MED FILL — SALONPAS GEL-PATCH HOT: 0.025-1.25 | 3 days supply | Qty: 6 | Fill #0

## 2016-12-31 NOTE — Telephone Encounter (Signed)
Refill done.  

## 2017-04-08 ENCOUNTER — Telehealth: Payer: Self-pay | Admitting: Family Medicine

## 2017-04-08 NOTE — Telephone Encounter (Signed)
Pt's lab apt is scheduled for Monday 04/13/17, pt would like to come in on Friday to have labs.    Could provider place orders, please ?    ALSO, pt says that he would also like to have his PSA checked.

## 2017-04-13 ENCOUNTER — Encounter: Payer: Self-pay | Admitting: Family Medicine

## 2017-04-13 ENCOUNTER — Ambulatory Visit (INDEPENDENT_AMBULATORY_CARE_PROVIDER_SITE_OTHER): Payer: Managed Care, Other (non HMO) | Admitting: Family Medicine

## 2017-04-13 VITALS — BP 118/80 | HR 72 | Temp 97.7°F | Resp 18 | Wt 152.0 lb

## 2017-04-13 DIAGNOSIS — C61 Malignant neoplasm of prostate: Secondary | ICD-10-CM | POA: Diagnosis not present

## 2017-04-13 DIAGNOSIS — Z23 Encounter for immunization: Secondary | ICD-10-CM | POA: Diagnosis not present

## 2017-04-13 DIAGNOSIS — E784 Other hyperlipidemia: Secondary | ICD-10-CM

## 2017-04-13 DIAGNOSIS — M25551 Pain in right hip: Secondary | ICD-10-CM

## 2017-04-13 DIAGNOSIS — E7849 Other hyperlipidemia: Secondary | ICD-10-CM

## 2017-04-13 DIAGNOSIS — M25552 Pain in left hip: Secondary | ICD-10-CM

## 2017-04-13 DIAGNOSIS — Z0000 Encounter for general adult medical examination without abnormal findings: Secondary | ICD-10-CM

## 2017-04-13 DIAGNOSIS — M25511 Pain in right shoulder: Secondary | ICD-10-CM | POA: Diagnosis not present

## 2017-04-13 LAB — LIPID PANEL
CHOL/HDL RATIO: 4
Cholesterol: 198 mg/dL (ref 0–200)
HDL: 48.3 mg/dL (ref >39.00)
LDL Cholesterol: 124 mg/dL — ABNORMAL HIGH (ref 0–99)
NONHDL: 149.48
Triglycerides: 126 mg/dL (ref 0.0–149.0)
VLDL: 25.2 mg/dL (ref 0.0–40.0)

## 2017-04-13 LAB — CBC
HEMATOCRIT: 46.7 % (ref 39.0–52.0)
Hemoglobin: 16 g/dL (ref 13.0–17.0)
MCHC: 34.2 g/dL (ref 30.0–36.0)
MCV: 90.5 fl (ref 78.0–100.0)
Platelets: 208 10*3/uL (ref 150.0–400.0)
RBC: 5.16 Mil/uL (ref 4.22–5.81)
RDW: 12.6 % (ref 11.5–15.5)
WBC: 6.6 10*3/uL (ref 4.0–10.5)

## 2017-04-13 LAB — COMPREHENSIVE METABOLIC PANEL
ALK PHOS: 51 U/L (ref 39–117)
ALT: 19 U/L (ref 0–53)
AST: 19 U/L (ref 0–37)
Albumin: 4.3 g/dL (ref 3.5–5.2)
BUN: 14 mg/dL (ref 6–23)
CO2: 30 meq/L (ref 19–32)
Calcium: 9.8 mg/dL (ref 8.4–10.5)
Chloride: 102 mEq/L (ref 96–112)
Creatinine, Ser: 0.92 mg/dL (ref 0.40–1.50)
GFR: 109.28 mL/min (ref >60.00)
GLUCOSE: 88 mg/dL (ref 70–99)
POTASSIUM: 4.7 meq/L (ref 3.5–5.1)
Sodium: 140 mEq/L (ref 135–145)
TOTAL PROTEIN: 6.8 g/dL (ref 6.0–8.3)
Total Bilirubin: 0.6 mg/dL (ref 0.2–1.2)

## 2017-04-13 LAB — TSH: TSH: 1.3 u[IU]/mL (ref 0.35–4.50)

## 2017-04-13 LAB — PSA: PSA: 0.01 ng/mL — ABNORMAL LOW (ref 0.10–4.00)

## 2017-04-13 NOTE — Progress Notes (Signed)
Subjective:  I acted as a Education administrator for Dr. Charlett Blake. [place], [place]  Patient ID: John Lane, male    DOB: 10-31-1960, 56 y.o.   MRN: [id number]  No chief complaint on file.   HPI  Patient is in today for an annual exam. He feels well today. He is working full time. Exercises regularly and eats well. Notes some stiffness in his posterior hips/buttocks in am and with prolonged standing but no radicualr symptoms or incontinence. No falls or weakness. Also notes some stiffness in left shoulder. Trouble lifting things in front of him without anterior shoulder pain. Denies CP/palp/SOB/HA/congestion/fevers/GI or GU c/o. Taking meds as prescribed  Patient Care Team: Mosie Lukes, MD as PCP - General (Family Medicine)   Past Medical History:  Diagnosis Date  . Allergic state 02/11/2015  . Allergy   . H/O hematuria    2003  . H/O prostate cancer 08/28/2011  . Other and unspecified hyperlipidemia 01/18/2014  . Prostate cancer ([facility]) 12/24/11   Gleason 3+4=7, volume 15 gm    Past Surgical History:  Procedure Laterality Date  . APPENDECTOMY    . CIRCUMCISION  05/12/2012   Procedure: CIRCUMCISION ADULT;  Surgeon: Bernestine Amass, MD;  Location: [facility];  Service: Urology;  Laterality: N/A;  . ROBOT ASSISTED LAPAROSCOPIC RADICAL PROSTATECTOMY  05/12/2012   Procedure: ROBOTIC ASSISTED LAPAROSCOPIC RADICAL PROSTATECTOMY;  Surgeon: Bernestine Amass, MD;  Location: [facility];  Service: Urology;  Laterality: N/A;  . TONSILLECTOMY      Family History  Problem Relation Age of Onset  . Heart disease Brother        MI s/p stent  . Diabetes Father   . Hypertension Father   . Heart disease Father   . Arthritis Father   . Hypertension Mother   . Stroke Mother 63       left arm weakness, speech improving,   . Gout Mother   . Arthritis Sister   . Arthritis Sister   . Cancer Sister   . Hypertension Brother   . Hearing loss Brother   . Hearing loss Son   . Heart disease Brother        MI  . Hypertension  Brother     Social History   Social History  . Marital status: Married    Spouse name: N/A  . Number of children: N/A  . Years of education: N/A   Occupational History  . Not on file.   Social History Main Topics  . Smoking status: Former Smoker    Packs/day: 0.50    Years: 10.00    Quit date: 08/05/1991  . Smokeless tobacco: Never Used  . Alcohol use 1.2 oz/week    2 Standard drinks or equivalent per week     Comment: 1-2 weekly  . Drug use: No  . 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 Not on file   Social History Narrative   Married   Former smoker   Quit over 18 years ago-smoked for 10 years 1/2 ppd   3 children    Outpatient Medications Prior to Visit  Medication Sig Dispense Refill  . Acetaminophen (TYLENOL ARTHRITIS EXT RELIEF PO) Take by mouth.    [name] Kitchen acetaminophen (TYLENOL) 500 MG tablet Take 1 tablet (500 mg total) by mouth 3 (three) times daily as needed. 90 tablet 5  . BAYER ASPIRIN  EC LOW DOSE 81 MG EC tablet TAKE ONE TABLET BY MOUTH ONCE DAILY AS NEEDED FOR PAIN 400 tablet 0  . BISMATROL 262 MG/15ML suspension Take 30 mLs by mouth 3 (three) times daily as needed. 300 mL 3  . bismuth subsalicylate (PEPTO BISMOL) 262 MG/15ML suspension Take 30 mLs by mouth every 6 (six) hours as needed. 360 mL 3  . budesonide (RHINOCORT AQUA) 32 MCG/ACT nasal spray Place 1 spray into both nostrils daily. 8.6 g 5  . Camphor-Menthol-Methyl Sal 1.2-5.7-6.3 % PTCH Apply 1 patch topically 2 (two) times daily. 40 patch 1  . fluticasone (FLONASE) 50 MCG/ACT nasal spray Place 2 sprays into both nostrils daily. 47.4 g 3  . ibuprofen (ADVIL,MOTRIN) 200 MG tablet Take 1 tablet (200 mg total) by mouth 3 (three) times daily as needed. Advil Liqui-gel 240 tablet 0  . Krill Oil 500 MG CAPS Take 1 capsule (500 mg total) by mouth daily. 160 capsule 6  . Multiple Vitamins-Minerals  (AIRBORNE GUMMIES PO)     . naproxen sodium (ANAPROX) 220 MG tablet TAKE ONE CAPSULE BY MOUTH TWICE DAILY AS NEEDED 160 tablet 6  . olopatadine (PATANOL) 0.1 % ophthalmic solution INSTILL ONE DROP INTO EACH EYE TWICE DAILY AS NEEDED 5 mL 1  . Saline (SIMPLY SALINE) 0.9 % AERS Place 1 spray into the nose daily. 378 mL 2  . sildenafil (VIAGRA) 100 MG tablet Take 0.5-1 tablets (50-100 mg total) by mouth daily as needed for erectile dysfunction. 9 tablet 3  . triamcinolone (NASACORT ALLERGY 24HR) 55 MCG/ACT AERO nasal inhaler Place 2 sprays into the nose daily. 1 Inhaler 12  . Turmeric Curcumin 500 MG CAPS Take 500 mg by mouth daily. 250 capsule 1  . ZYRTEC-D ALLERGY & CONGESTION 5-120 MG tablet TAKE ONE TABLET BY MOUTH ONCE DAILY AS NEEDED FOR ALLERGY 24 tablet 2   No facility-administered medications prior to visit.     No Known Allergies  Review of Systems  Constitutional: Negative for fever and malaise/fatigue.  HENT: Negative for congestion.   Eyes: Negative for blurred vision.  Respiratory: Negative for cough and shortness of breath.   Cardiovascular: Negative for chest pain, palpitations and leg swelling.  Gastrointestinal: Negative for vomiting.  Musculoskeletal: Positive for joint pain. Negative for back pain.  Skin: Negative for rash.  Neurological: Negative for loss of consciousness and headaches.       Objective:    Physical Exam  Constitutional: He is oriented to person, place, and time. He appears well-developed and well-nourished. No distress.  HENT:  Head: Normocephalic and atraumatic.  Eyes: Conjunctivae are normal.  Neck: Normal range of motion. No thyromegaly present.  Cardiovascular: Normal rate and regular rhythm.   Pulmonary/Chest: Effort normal and breath sounds normal. He has no wheezes.  Abdominal: Soft. Bowel sounds are normal. There is no tenderness.  Musculoskeletal: Normal range of motion. He exhibits no edema or deformity.  Neurological: He is alert  and oriented to person, place, and time.  Skin: Skin is warm and dry. He is not diaphoretic.  Psychiatric: He has a normal mood and affect.    BP 118/80 (BP Location: Left Arm, Patient Position: Sitting, Cuff Size: Normal)   Pulse 72   Temp 97.7 F (36.5 C) (Oral)   Resp 18   Wt 152 lb (68.9 kg)   SpO2 98%   BMI 25.29 kg/m  Wt Readings from Last 3 Encounters:  04/13/17 152 lb (68.9 kg)  03/31/16 150 lb 2 oz (68.1 kg)  04/13/15 147 lb (66.7 kg)   BP Readings from Last 3 Encounters:  04/13/17 118/80  03/31/16 122/88  04/13/15 130/90     Immunization History  Administered Date(s) Administered  . Influenza Split 08/28/2011  . Influenza,inj,Quad PF,6+ Mos 03/31/2016, 04/13/2017  . Td 02/14/2003  . Tdap 01/10/2014    Health Maintenance  Topic Date Due  . HIV Screening  11/07/1975  . COLONOSCOPY  10/29/2016  . INFLUENZA VACCINE  03/04/2017  . TETANUS/TDAP  01/11/2024  . Hepatitis C Screening  Completed    Lab Results  Component Value Date   WBC 6.4 03/31/2016   HGB 15.8 03/31/2016   HCT 46.2 03/31/2016   PLT 222.0 03/31/2016   GLUCOSE 92 03/31/2016   CHOL 188 03/31/2016   TRIG 88.0 03/31/2016   HDL 45.90 03/31/2016   LDLDIRECT 99.0 02/06/2015   LDLCALC 125 (H) 03/31/2016   ALT 16 03/31/2016   AST 17 03/31/2016   NA 139 03/31/2016   K 4.1 03/31/2016   CL 103 03/31/2016   CREATININE 0.90 03/31/2016   BUN 12 03/31/2016   CO2 30 03/31/2016   TSH 1.17 03/31/2016   PSA 0.01 Repeated and verified X2. (L) 03/31/2016   MICROALBUR 1.3 04/14/2008    Lab Results  Component Value Date   TSH 1.17 03/31/2016   Lab Results  Component Value Date   WBC 6.4 03/31/2016   HGB 15.8 03/31/2016   HCT 46.2 03/31/2016   MCV 89.8 03/31/2016   PLT 222.0 03/31/2016   Lab Results  Component Value Date   NA 139 03/31/2016   K 4.1 03/31/2016   CO2 30 03/31/2016   GLUCOSE 92 03/31/2016   BUN 12 03/31/2016   CREATININE 0.90 03/31/2016   BILITOT 0.5 03/31/2016   ALKPHOS  52 03/31/2016   AST 17 03/31/2016   ALT 16 03/31/2016   PROT 6.7 03/31/2016   ALBUMIN 4.2 03/31/2016   CALCIUM 9.0 03/31/2016   GFR 112.51 03/31/2016   Lab Results  Component Value Date   CHOL 188 03/31/2016   Lab Results  Component Value Date   HDL 45.90 03/31/2016   Lab Results  Component Value Date   LDLCALC 125 (H) 03/31/2016   Lab Results  Component Value Date   TRIG 88.0 03/31/2016   Lab Results  Component Value Date   CHOLHDL 4 03/31/2016   No results found for: HGBA1C       Assessment & Plan:   Problem List Items Addressed This Visit    Preventative health care    Patient encouraged to maintain heart healthy diet, regular exercise, adequate sleep. Consider daily probiotics. Take medications as prescribed      Relevant Orders   CBC   Comprehensive metabolic panel   Lipid panel   TSH   PSA   Prostate cancer (HCC)    Check PSA today at his request      Hyperlipidemia    Very mild, repeat lipid today. Encouraged heart healthy diet, increase exercise, avoid trans fats, consider a krill oil cap daily      Pain in joint, shoulder region    Previously on right from a football injury but now having some pain and stiffness on the left. Hurts when raising arm forward especially when lifting something. Consider sports med referral. Ice and lidocaine gel       Hip pain, bilateral    Just some tightness        Other Visit Diagnoses    Needs flu shot    -  Primary   Relevant Orders   Flu Vaccine QUAD 36+ mos IM (Fluarix & Fluzone Quad PF (Completed)      I am having Mr. Chauca maintain his sildenafil, triamcinolone, budesonide, Turmeric Curcumin, fluticasone, BAYER ASPIRIN EC LOW DOSE, ibuprofen, naproxen sodium, Krill Oil, Saline, BISMATROL, Multiple Vitamins-Minerals (AIRBORNE GUMMIES PO), acetaminophen, Acetaminophen (TYLENOL ARTHRITIS EXT RELIEF PO), bismuth subsalicylate, olopatadine, ZYRTEC-D ALLERGY & CONGESTION, and Camphor-Menthol-Methyl  Sal.  No orders of the defined types were placed in this encounter.   CMA served as Education administrator during this visit. History, Physical and Plan performed by medical provider. Documentation and orders reviewed and attested to.  Penni Homans, MD

## 2017-04-13 NOTE — Assessment & Plan Note (Signed)
Patient encouraged to maintain heart healthy diet, regular exercise, adequate sleep. Consider daily probiotics. Take medications as prescribed 

## 2017-04-13 NOTE — Assessment & Plan Note (Signed)
Very mild, repeat lipid today. Encouraged heart healthy diet, increase exercise, avoid trans fats, consider a krill oil cap daily

## 2017-04-13 NOTE — Assessment & Plan Note (Signed)
Previously on right from a football injury but now having some pain and stiffness on the left. Hurts when raising arm forward especially when lifting something. Consider sports med referral. Ice and lidocaine gel

## 2017-04-13 NOTE — Patient Instructions (Addendum)
Shingrix is the new shingels shot. 2 shots over 6 months. Call insurance and confirm they will cover and call for appt to get or get at pharmacy and have them send [place] a copy of the immunization   Preventive Care 40-64 Years, Male Preventive care refers to lifestyle choices and visits with your health care provider that can promote health and wellness. What does preventive care include?  A yearly physical exam. This is also called an annual well check.  Dental exams once or twice a year.  Routine eye exams. Ask your health care provider how often you should have your eyes checked.  Personal lifestyle choices, including: ? Daily care of your teeth and gums. ? Regular physical activity. ? Eating a healthy diet. ? Avoiding tobacco and drug use. ? Limiting alcohol use. ? Practicing safe sex. ? Taking low-dose aspirin every day starting at age 25. What happens during an annual well check? The services and screenings done by your health care provider during your annual well check will depend on your age, overall health, lifestyle risk factors, and family history of disease. Counseling Your health care provider may ask you questions about your:  Alcohol use.  Tobacco use.  Drug use.  Emotional well-being.  Home and relationship well-being.  Sexual activity.  Eating habits.  Work and work Statistician.  Screening You may have the following tests or measurements:  Height, weight, and BMI.  Blood pressure.  Lipid and cholesterol levels. These may be checked every 5 years, or more frequently if you are over 44 years old.  Skin check.  Lung cancer screening. You may have this screening every year starting at age 33 if you have a 30-pack-year history of smoking and currently smoke or have quit within the past 15 years.  Fecal occult blood test (FOBT) of the stool. You may have this test every year starting at age 44.  Flexible sigmoidoscopy or colonoscopy. You may have a  sigmoidoscopy every 5 years or a colonoscopy every 10 years starting at age 65.  Prostate cancer screening. Recommendations will vary depending on your family history and other risks.  Hepatitis C blood test.  Hepatitis B blood test.  Sexually transmitted disease (STD) testing.  Diabetes screening. This is done by checking your blood sugar (glucose) after you have not eaten for a while (fasting). You may have this done every 1-3 years.  Discuss your test results, treatment options, and if necessary, the need for more tests with your health care provider. Vaccines Your health care provider may recommend certain vaccines, such as:  Influenza vaccine. This is recommended every year.  Tetanus, diphtheria, and acellular pertussis (Tdap, Td) vaccine. You may need a Td booster every 10 years.  Varicella vaccine. You may need this if you have not been vaccinated.  Zoster vaccine. You may need this after age 69.  Measles, mumps, and rubella (MMR) vaccine. You may need at least one dose of MMR if you were born in 1957 or later. You may also need a second dose.  Pneumococcal 13-valent conjugate (PCV13) vaccine. You may need this if you have certain conditions and have not been vaccinated.  Pneumococcal polysaccharide (PPSV23) vaccine. You may need one or two doses if you smoke cigarettes or if you have certain conditions.  Meningococcal vaccine. You may need this if you have certain conditions.  Hepatitis A vaccine. You may need this if you have certain conditions or if you travel or work in places where you may be  exposed to hepatitis A.  Hepatitis B vaccine. You may need this if you have certain conditions or if you travel or work in places where you may be exposed to hepatitis B.  Haemophilus influenzae type b (Hib) vaccine. You may need this if you have certain risk factors.  Talk to your health care provider about which screenings and vaccines you need and how often you need  them. This information is not intended to replace advice given to you by your health care provider. Make sure you discuss any questions you have with your health care provider. Document Released: 08/17/2015 Document Revised: 04/09/2016 Document Reviewed: 05/22/2015 Elsevier Interactive Patient Education  2017 [employer].

## 2017-04-13 NOTE — Assessment & Plan Note (Signed)
Just some tightness

## 2017-04-13 NOTE — Assessment & Plan Note (Signed)
Check PSA today at his request

## 2017-04-14 ENCOUNTER — Encounter: Payer: BLUE CROSS/BLUE SHIELD | Admitting: Family Medicine

## 2017-05-13 ENCOUNTER — Encounter: Payer: Self-pay | Admitting: Gastroenterology

## 2017-05-16 ENCOUNTER — Other Ambulatory Visit: Payer: Self-pay | Admitting: Family Medicine

## 2017-06-22 ENCOUNTER — Telehealth: Payer: Self-pay | Admitting: Family Medicine

## 2017-06-22 NOTE — Telephone Encounter (Signed)
Relation to pt: self  Call back number: [phone]  Pharmacy: [place], [place] [phone] (Phone) [phone] (Fax)     Reason for call:  Patient requesting pain medication for buttucks stating PCP is aware of symptoms. Patient states he was trying Camphor-Menthol-Methyl Sal (SALONPAS) 1.2-5.7-6.3 % PTCH and its a temporary relief, please advise if Rx can be sent today

## 2017-06-23 NOTE — Telephone Encounter (Signed)
Tramadol 50 mg tabs 1 tab po bid prn severe pain, disp #15 if he wants a topical cream for pain need him to clarify where his skin is irritated  So I can decide what to rx. Is the skin broken down?

## 2017-06-23 NOTE — Telephone Encounter (Signed)
Please advise 

## 2017-06-24 MED ORDER — TRAMADOL HCL 50 MG PO TABS: 50.0000 mg | ORAL_TABLET | Freq: Two times a day (BID) | ORAL | 0 refills | Status: DC | PRN

## 2017-06-24 MED ORDER — CAMPHOR-MENTHOL-METHYL SAL 1.2-5.7-6.3 % EX PTCH: 1.0000 | MEDICATED_PATCH | Freq: Two times a day (BID) | CUTANEOUS | 1 refills | Status: DC

## 2017-06-24 MED FILL — traMADol HCL 50 MG TABS: 50 | 7 days supply | Qty: 14 | Fill #0

## 2017-06-24 MED FILL — SALONPAS GEL-PATCH HOT: 0.025-1.25 | 3 days supply | Qty: 6 | Fill #0

## 2017-06-24 NOTE — Telephone Encounter (Addendum)
Dr Charlett Blake-- please see pt's response and advise if any further recommendations?  Notified pt of below. He denies skin irritation or break down. States pain has been intermittent for x 1 year and is in left buttock and radiates down left leg. Salon Pas and applying heat helps at times. He is agreeable to to try tramadol. Advised pt if tramadol does not help that he should schedule an appointment with PCP for further evaluation. Pt voices understanding and requests refill of Salon Pas as well. Rxs sent to [facility] as he would like to pick up Rx today.

## 2017-06-24 NOTE — Addendum Note (Signed)
Addended by: Kelle Darting A on: 06/24/2017 02:49 PM   Modules accepted: Orders

## 2017-06-24 NOTE — Telephone Encounter (Signed)
Called patient left message for patient to call the office  

## 2017-06-28 NOTE — Telephone Encounter (Signed)
Thanks, he will let us know if he continues to have symptoms and he needs an appointment

## 2017-07-02 ENCOUNTER — Telehealth: Payer: Self-pay | Admitting: *Deleted

## 2017-07-02 NOTE — Telephone Encounter (Signed)
Patient called and rescheduled his PV for 12/5.

## 2017-07-02 NOTE — Telephone Encounter (Signed)
Called patient again left message for patient to call and reschedule his missed pre-visit appointment from yesterday.  I advised that if we do not hear anything back before 5 o'clock today.I will cancel his colonoscopy appointment and he can call to reschedule both pre visit and colonoscopy.

## 2017-07-08 ENCOUNTER — Other Ambulatory Visit: Payer: Self-pay

## 2017-07-08 ENCOUNTER — Ambulatory Visit (AMBULATORY_SURGERY_CENTER): Payer: Managed Care, Other (non HMO) | Admitting: *Deleted

## 2017-07-08 VITALS — Ht 65.0 in | Wt 151.0 lb

## 2017-07-08 DIAGNOSIS — Z86010 Personal history of colonic polyps: Secondary | ICD-10-CM

## 2017-07-08 MED ORDER — NA SULFATE-K SULFATE-MG SULF 17.5-3.13-1.6 GM/177ML PO SOLN: 1.0000 | Freq: Once | ORAL | 0 refills | Status: AC

## 2017-07-08 NOTE — Progress Notes (Signed)
Denies allergies to eggs or soy products. Denies complications with sedation or anesthesia. Denies O2 use. Denies use of diet or weight loss medications.  Emmi instructions given for colonoscopy.  

## 2017-07-09 ENCOUNTER — Telehealth: Payer: Self-pay | Admitting: Family Medicine

## 2017-07-09 ENCOUNTER — Encounter: Payer: Self-pay | Admitting: Gastroenterology

## 2017-07-09 DIAGNOSIS — Z79899 Other long term (current) drug therapy: Secondary | ICD-10-CM

## 2017-07-09 NOTE — Telephone Encounter (Signed)
Copied from [facility]. Topic: Quick Communication - See Telephone Encounter >> Jul 09, 2017 12:45 PM Boyd Kerbs wrote: CRM for notification. See Telephone encounter for: patient is requesting prescription for  Tramadol . He has been taking 1 at bedtime and has been working. [place], [place] - [place]  07/09/17.

## 2017-07-10 MED ORDER — TRAMADOL HCL 50 MG PO TABS: 50.0000 mg | ORAL_TABLET | Freq: Two times a day (BID) | ORAL | 0 refills | Status: DC | PRN

## 2017-07-10 NOTE — Telephone Encounter (Signed)
He can have 10 tabs til seen next week of the tramadol he has not been seen in too long for a full prescription. Needs UDS and Contract signed

## 2017-07-10 NOTE — Telephone Encounter (Signed)
Request for Tramadol. Pt. Seen this past September.Thanks.

## 2017-07-10 NOTE — Telephone Encounter (Signed)
Per dr Charlett Blake he can have his #15 and the next time he needs a refill he needs to make an appointment to see her    Patient notified

## 2017-07-15 ENCOUNTER — Other Ambulatory Visit: Payer: Self-pay

## 2017-07-15 ENCOUNTER — Encounter: Payer: Self-pay | Admitting: Gastroenterology

## 2017-07-15 ENCOUNTER — Ambulatory Visit (AMBULATORY_SURGERY_CENTER): Payer: Managed Care, Other (non HMO) | Admitting: Gastroenterology

## 2017-07-15 VITALS — BP 130/86 | HR 72 | Temp 96.8°F | Resp 17 | Ht 65.0 in | Wt 151.0 lb

## 2017-07-15 DIAGNOSIS — K635 Polyp of colon: Secondary | ICD-10-CM | POA: Diagnosis not present

## 2017-07-15 DIAGNOSIS — Z86010 Personal history of colonic polyps: Secondary | ICD-10-CM

## 2017-07-15 DIAGNOSIS — D125 Benign neoplasm of sigmoid colon: Secondary | ICD-10-CM

## 2017-07-15 MED ORDER — SODIUM CHLORIDE 0.9 % IV SOLN: 500.0000 mL | Freq: Once | INTRAVENOUS | Status: DC

## 2017-07-15 NOTE — Progress Notes (Signed)
Report to PACU, RN, vss, BBS= Clear.  

## 2017-07-15 NOTE — Progress Notes (Signed)
Pt's states no medical or surgical changes since previsit or office visit. 

## 2017-07-15 NOTE — Op Note (Signed)
[place] Patient Name: John Lane Procedure Date: 07/15/2017 9:22 AM MRN: [id number] Endoscopist: Ladene Artist , MD Age: 56 Referring MD:  Date of Birth: June 09, 1961 Gender: Male Account #: [id number] Procedure:                Colonoscopy Indications:              Surveillance: Personal history of adenomatous                            polyps on last colonoscopy 5 years ago Medicines:                Monitored Anesthesia Care Procedure:                Pre-Anesthesia Assessment:                           - Prior to the procedure, a History and Physical                            was performed, and patient medications and                            allergies were reviewed. The patient's tolerance of                            previous anesthesia was also reviewed. The risks                            and benefits of the procedure and the sedation                            options and risks were discussed with the patient.                            All questions were answered, and informed consent                            was obtained. Prior Anticoagulants: The patient has                            taken no previous anticoagulant or antiplatelet                            agents. ASA Grade Assessment: II - A patient with                            mild systemic disease. After reviewing the risks                            and benefits, the patient was deemed in                            satisfactory condition to undergo the procedure.  After obtaining informed consent, the colonoscope                            was passed under direct vision. Throughout the                            procedure, the patient's blood pressure, pulse, and                            oxygen saturations were monitored continuously. The                            Colonoscope was introduced through the anus and                            advanced to the the cecum,  identified by                            appendiceal orifice and ileocecal valve. The                            ileocecal valve, appendiceal orifice, and rectum                            were photographed. The quality of the bowel                            preparation was excellent. The colonoscopy was                            performed without difficulty. The patient tolerated                            the procedure well. Scope In: 9:37:19 AM Scope Out: 9:49:07 AM Scope Withdrawal Time: 0 hours 9 minutes 25 seconds  Total Procedure Duration: 0 hours 11 minutes 48 seconds  Findings:                 The perianal and digital rectal examinations were                            normal.                           A 5 mm polyp was found in the sigmoid colon. The                            polyp was sessile. The polyp was removed with a                            cold biopsy forceps. Resection and retrieval were                            complete.  The exam was otherwise without abnormality on                            direct and retroflexion views. Complications:            No immediate complications. Estimated blood loss:                            None. Estimated Blood Loss:     Estimated blood loss: none. Impression:               - One 5 mm polyp in the sigmoid colon, removed with                            a cold biopsy forceps. Resected and retrieved.                           - The examination was otherwise normal on direct                            and retroflexion views. Recommendation:           - Repeat colonoscopy in 5 years for surveillance.                           - Patient has a contact number available for                            emergencies. The signs and symptoms of potential                            delayed complications were discussed with the                            patient. Return to normal activities tomorrow.                             Written discharge instructions were provided to the                            patient.                           - Resume previous diet.                           - Continue present medications.                           - Await pathology results. Ladene Artist, MD 07/15/2017 9:51:33 AM This report has been signed electronically.

## 2017-07-15 NOTE — Patient Instructions (Signed)
Colon polyp removed today. Handout given on polyps. Repeat colonoscopy in 5 years. Resume current medications. Call us with any questions or concerns. Thank you!    YOU HAD AN ENDOSCOPIC PROCEDURE TODAY AT [facility]:   Refer to the procedure report that was given to you for any specific questions about what was found during the examination.  If the procedure report does not answer your questions, please call your gastroenterologist to clarify.  If you requested that your care partner not be given the details of your procedure findings, then the procedure report has been included in a sealed envelope for you to review at your convenience later.  YOU SHOULD EXPECT: Some feelings of bloating in the abdomen. Passage of more gas than usual.  Walking can help get rid of the air that was put into your GI tract during the procedure and reduce the bloating. If you had a lower endoscopy (such as a colonoscopy or flexible sigmoidoscopy) you may notice spotting of blood in your stool or on the toilet paper. If you underwent a bowel prep for your procedure, you may not have a normal bowel movement for a few days.  Please Note:  You might notice some irritation and congestion in your nose or some drainage.  This is from the oxygen used during your procedure.  There is no need for concern and it should clear up in a day or so.  SYMPTOMS TO REPORT IMMEDIATELY:   Following lower endoscopy (colonoscopy or flexible sigmoidoscopy):  Excessive amounts of blood in the stool  Significant tenderness or worsening of abdominal pains  Swelling of the abdomen that is new, acute  Fever of 100F or higher  For urgent or emergent issues, a gastroenterologist can be reached at any hour by calling [phone].   DIET:  We do recommend a small meal at first, but then you may proceed to your regular diet.  Drink plenty of fluids but you should avoid alcoholic beverages for 24 hours.  ACTIVITY:  You should  plan to take it easy for the rest of today and you should NOT DRIVE or use heavy machinery until tomorrow (because of the sedation medicines used during the test).    FOLLOW UP: Our staff will call the number listed on your records the next business day following your procedure to check on you and address any questions or concerns that you may have regarding the information given to you following your procedure. If we do not reach you, we will leave a message.  However, if you are feeling well and you are not experiencing any problems, there is no need to return our call.  We will assume that you have returned to your regular daily activities without incident.  If any biopsies were taken you will be contacted by phone or by letter within the next 1-3 weeks.  Please call us at [phone] if you have not heard about the biopsies in 3 weeks.    SIGNATURES/CONFIDENTIALITY: You and/or your care partner have signed paperwork which will be entered into your electronic medical record.  These signatures attest to the fact that that the information above on your After Visit Summary has been reviewed and is understood.  Full responsibility of the confidentiality of this discharge information lies with you and/or your care-partner.

## 2017-07-16 ENCOUNTER — Telehealth: Payer: Self-pay | Admitting: *Deleted

## 2017-07-16 NOTE — Telephone Encounter (Signed)
Message left

## 2017-07-16 NOTE — Telephone Encounter (Signed)
  Follow up Call-  Call back number 07/15/2017  Post procedure Call Back phone  # [phone]  Permission to leave phone message Yes  Some recent data might be hidden    [facility]

## 2017-07-31 ENCOUNTER — Encounter: Payer: Self-pay | Admitting: Gastroenterology

## 2017-09-09 ENCOUNTER — Ambulatory Visit (HOSPITAL_BASED_OUTPATIENT_CLINIC_OR_DEPARTMENT_OTHER)
Admission: RE | Admit: 2017-09-09 | Discharge: 2017-09-09 | Disposition: A | Discharge status: Home / Self Care | Payer: Managed Care, Other (non HMO) | Source: Ambulatory Visit | Attending: Medical | Admitting: Medical

## 2017-09-09 ENCOUNTER — Encounter: Payer: Self-pay | Admitting: Medical

## 2017-09-09 ENCOUNTER — Ambulatory Visit (INDEPENDENT_AMBULATORY_CARE_PROVIDER_SITE_OTHER): Payer: Managed Care, Other (non HMO) | Admitting: Medical

## 2017-09-09 ENCOUNTER — Telehealth: Payer: Self-pay | Admitting: Family Medicine

## 2017-09-09 VITALS — BP 128/90 | HR 81 | Temp 98.0°F | Resp 16 | Ht 65.0 in | Wt 150.8 lb

## 2017-09-09 DIAGNOSIS — M5442 Lumbago with sciatica, left side: Secondary | ICD-10-CM

## 2017-09-09 DIAGNOSIS — M25561 Pain in right knee: Secondary | ICD-10-CM

## 2017-09-09 DIAGNOSIS — R102 Pelvic and perineal pain: Secondary | ICD-10-CM

## 2017-09-09 DIAGNOSIS — M5432 Sciatica, left side: Secondary | ICD-10-CM

## 2017-09-09 DIAGNOSIS — M25562 Pain in left knee: Secondary | ICD-10-CM

## 2017-09-09 DIAGNOSIS — M898X1 Other specified disorders of bone, shoulder: Secondary | ICD-10-CM | POA: Diagnosis not present

## 2017-09-09 MED ORDER — TRAMADOL HCL 50 MG PO TABS: 50.0000 mg | ORAL_TABLET | Freq: Three times a day (TID) | ORAL | 0 refills | Status: DC | PRN

## 2017-09-09 NOTE — Telephone Encounter (Signed)
Copied from [place]. Topic: Quick Communication - Rx Refill/Question >> Sep 09, 2017  4:59 PM Clack, Laban Emperor wrote: Medication:  traMADol (ULTRAM) 50 MG tablet [[id number]]    Has the patient contacted their pharmacy? No. (pt would like to change pharmacies)   (Agent: If no, request that the patient contact the pharmacy for the refill.)   Preferred Pharmacy (with phone number or street name): [place], [place] - [address] [phone] (Phone) [phone] (Fax)  pt would like to change pharmacies    Agent: Please be advised that RX refills may take up to 3 business days. We ask that you follow-up with your pharmacy.

## 2017-09-09 NOTE — Progress Notes (Signed)
Subjective:    Patient ID: John Lane, male    DOB: 06/14/1961, 57 y.o.   MRN: [id number]  HPI  Pt in for some left buttock area pain. Pain has been coming and going for one year. Not related to fall or trauma. Pt states her pcp is aware of the pain. On one visit in the past pcp rx'd tramadol.  This seemed to help but never resolved the pain completely.   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  He describes that work might be an exacerbating factor.  On review pt does report some higher up pain at time in left SI area.  But does not report any direct SI tenderness presently.   Pt in past tried some salon pas.Some relief and also tried tramadol.  Also some pain medial aspect of scapula. Pain on and off for 6 months- 1 year.  Pt states noticed pulling cart at work.  Hx of knee pain bilateral.(He mentioned this at the very end of interview.)  Review of Systems  Constitutional: Negative for fatigue and fever.  Respiratory: Negative for chest tightness, shortness of breath and wheezing.   Cardiovascular: Negative for chest pain and palpitations.  Gastrointestinal: Negative for abdominal pain.  Musculoskeletal: Negative for back pain, joint swelling and neck stiffness.       See HPI  Skin: Negative for rash.  Neurological: Negative for dizziness, speech difficulty, weakness and headaches.  Hematological: Negative for adenopathy. Does not bruise/bleed easily.  Psychiatric/Behavioral: Negative for behavioral problems, confusion, dysphoric mood and suicidal ideas. The patient is not nervous/anxious.    Past Medical History:  Diagnosis Date  . Allergic state 02/11/2015  . Allergy   . H/O hematuria    2003  . H/O prostate cancer 08/28/2011  . Other and unspecified hyperlipidemia 01/18/2014  . 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 Marital status: Married    Spouse name: Not on file  .  Number of children: Not on file  . Years of education: Not on file  . Highest education level: Not on file  Social Needs  . Financial resource strain: Not on file  . Food insecurity - worry: Not on file  . Food insecurity - inability: Not on file  . Transportation needs - medical: Not on file  . Transportation needs - non-medical: Not on file  Occupational History  . Not on file  Tobacco Use  . Smoking status: Former Smoker    Packs/day: 0.50    Years: 10.00    Pack years: 5.00    Last attempt to quit: 08/05/1991    Years since quitting: 26.1  . Smokeless tobacco: Never Used  Substance and Sexual Activity  . Alcohol use: Yes    Alcohol/week: 1.2 oz    Types: 2 Standard drinks or equivalent per week    Comment: 1-2 weekly  . Drug use: No  . 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 Not on file  Social History Narrative   Married   Former smoker   Quit over 18 years ago-smoked for 10 years 1/2 ppd   3 children    Past Surgical History:  Procedure Laterality Date  . APPENDECTOMY    . CIRCUMCISION  05/12/2012   Procedure: CIRCUMCISION ADULT;  Surgeon:  Bernestine Amass, MD;  Location: [facility];  Service: Urology;  Laterality: N/A;  . ROBOT ASSISTED LAPAROSCOPIC RADICAL PROSTATECTOMY  05/12/2012   Procedure: ROBOTIC ASSISTED LAPAROSCOPIC RADICAL PROSTATECTOMY;  Surgeon: Bernestine Amass, MD;  Location: [facility];  Service: Urology;  Laterality: N/A;  . TONSILLECTOMY      Family History  Problem Relation Age of Onset  . Heart disease Brother        MI s/p stent  . Diabetes Father   . Hypertension Father   . Heart disease Father   . Arthritis Father   . Hypertension Mother   . Stroke Mother 74       left arm weakness, speech improving,   . Gout Mother   . Arthritis Sister   . Arthritis Sister   . Cancer Sister   . Hypertension Brother   . Hearing loss Brother   . Hearing  loss Son   . Heart disease Brother        MI  . Hypertension Brother   . Colon cancer Neg Hx   . Esophageal cancer Neg Hx   . Rectal cancer Neg Hx   . Stomach cancer Neg Hx     No Known Allergies  Current Outpatient Medications on File Prior to Visit  Medication Sig Dispense Refill  . Acetaminophen (TYLENOL ARTHRITIS EXT RELIEF PO) Take by mouth.    [name] Kitchen acetaminophen (TYLENOL) 500 MG tablet Take 1 tablet (500 mg total) by mouth 3 (three) times daily as needed. 90 tablet 5  . BAYER ASPIRIN EC LOW DOSE 81 MG EC tablet TAKE ONE TABLET BY MOUTH ONCE DAILY AS NEEDED FOR PAIN 400 tablet 0  . BISMATROL 262 MG/15ML suspension Take 30 mLs by mouth 3 (three) times daily as needed. 300 mL 3  . bismuth subsalicylate (PEPTO BISMOL) 262 MG/15ML suspension Take 30 mLs by mouth every 6 (six) hours as needed. 360 mL 3  . budesonide (RHINOCORT AQUA) 32 MCG/ACT nasal spray Place 1 spray into both nostrils daily. 8.6 g 5  . Camphor-Menthol-Methyl Sal 1.2-5.7-6.3 % PTCH Apply 1 patch topically 2 (two) times daily. 40 patch 1  . Cholecalciferol (VITAMIN D PO) Take by mouth.    . fluticasone (FLONASE) 50 MCG/ACT nasal spray Place 2 sprays into both nostrils daily. 47.4 g 3  . ibuprofen (ADVIL,MOTRIN) 200 MG tablet Take 1 tablet (200 mg total) by mouth 3 (three) times daily as needed. Advil Liqui-gel 240 tablet 0  . Krill Oil 500 MG CAPS Take 1 capsule (500 mg total) by mouth daily. 160 capsule 6  . Multiple Vitamins-Minerals (AIRBORNE GUMMIES PO)     . naproxen sodium (ANAPROX) 220 MG tablet TAKE ONE CAPSULE BY MOUTH TWICE DAILY AS NEEDED 160 tablet 6  . olopatadine (PATANOL) 0.1 % ophthalmic solution INSTILL ONE DROP INTO EACH EYE TWICE DAILY AS NEEDED 5 mL 1  . Saline (SIMPLY SALINE) 0.9 % AERS Place 1 spray into the nose daily. 378 mL 2  . sildenafil (VIAGRA) 100 MG tablet Take 0.5-1 tablets (50-100 mg total) by mouth daily as needed for erectile dysfunction. 9 tablet 3  . traMADol (ULTRAM) 50 MG tablet Take  1 tablet (50 mg total) by mouth 2 (two) times daily as needed. 15 tablet 0  . triamcinolone (NASACORT ALLERGY 24HR) 55 MCG/ACT AERO nasal inhaler Place 2 sprays into the nose daily. 1 Inhaler 12  . Turmeric Curcumin 500 MG CAPS Take 500 mg by mouth daily. 250 capsule 1  . ZYRTEC-D ALLERGY &  CONGESTION 5-120 MG tablet TAKE 1 TABLET BY MOUTH ONCE DAILY AS NEEDED FOR  ALLERGIES 24 tablet 2   No current facility-administered medications on file prior to visit.     BP (!) 130/97 (BP Location: Left Arm, Patient Position: Sitting, Cuff Size: Normal)   Pulse 81   Temp 98 F (36.7 C) (Oral)   Resp 16   Ht 5\' 5"  (1.651 m)   Wt 150 lb 12.8 oz (68.4 kg)   SpO2 100%   BMI 25.09 kg/m       Objective:   Physical Exam  General Appearance- Not in acute distress.    Chest and Lung Exam Auscultation: Breath sounds:-Normal. Clear even and unlabored. Adventitious sounds:- No Adventitious sounds.  Cardiovascular Auscultation:Rythm - Regular, rate and rythm. Heart Sounds -Normal heart sounds.  Abdomen Inspection:-Inspection Normal.  Palpation/Perucssion: Palpation and Percussion of the abdomen reveal- Non Tender, No Rebound tenderness, No rigidity(Guarding) and No Palpable abdominal masses.  Liver:-Normal.  Spleen:- Normal.   Back No miid lumbar spine tenderness to palpation. No si tenderness presently.(sometime has direct left si tenderness at times but not presently)  Lower ext neurologic  L5-S1 sensation intact bilaterally. Normal patellar reflexes bilaterally. No foot drop bilaterally.  Left Buttock- no pain on palpation presently.  Left hip- no pain on palpation or range of motion.  Knees-on flexion and extension of knees no obvious crepitus.  No erythema, or edema.  No joint instability.      Assessment & Plan:  For your chronic left buttock region pain, left SI area pain and scapular region pain, I am going to refer you to sports medicine.  I do think it is a good idea  since the pain has been prolonged duration.  Also will ask them to give opinion regarding your bilateral knee pain.  I refilled your tramadol and can use 1 every 8 hours as needed for moderate severe pain. Also you can use low-dose ibuprofen 200-400 mg every 8 hours if needed.  I want you to check your blood pressure at home and confirm that readings are better than today.  If your blood pressures are less than 130/80 then you could use 600-800 mg of ibuprofen.  Follow-up date to be determined after reviewing x-rays and sports medicine note.  Mackie Pai, PA-C

## 2017-09-09 NOTE — Patient Instructions (Signed)
For your chronic left buttock region pain, left SI area pain and scapular region pain, I am going to refer you to sports medicine.  I do think it is a good idea since the pain has been prolonged duration.  Also will ask them to give opinion regarding your bilateral knee pain.  I refilled your tramadol and can use 1 every 8 hours as needed for moderate severe pain. Also you can use low-dose ibuprofen 200-400 mg every 8 hours if needed.  I want you to check your blood pressure at home and confirm that readings are better than today.  If your blood pressures are less than 130/80 then you could use 600-800 mg of ibuprofen.  Follow-up date to be determined after reviewing x-rays and sports medicine note.

## 2017-09-10 ENCOUNTER — Telehealth: Payer: Self-pay | Admitting: Medical

## 2017-09-10 ENCOUNTER — Ambulatory Visit: Payer: Self-pay

## 2017-09-10 MED ORDER — CYCLOBENZAPRINE HCL 10 MG PO TABS: 10.0000 mg | ORAL_TABLET | Freq: Every day | ORAL | 0 refills | Status: DC

## 2017-09-10 MED ORDER — TRAMADOL HCL 50 MG PO TABS: 50.0000 mg | ORAL_TABLET | Freq: Three times a day (TID) | ORAL | 0 refills | Status: DC | PRN

## 2017-09-10 NOTE — Telephone Encounter (Signed)
So patient can fill prescription of tramadol.  Did you notify the pharmacy?

## 2017-09-10 NOTE — Telephone Encounter (Signed)
I will send in prescription of Flexeril to his pharmacy.

## 2017-09-10 NOTE — Telephone Encounter (Addendum)
I talked with patient's pharmacy and they pulled Narx report and stated patient was getting 120 tablets of Percocet on somewhat regular basis from MD in [place].  Also he was on Embeda daily.   Last prescription of Percocet was filled on 08/13/2016.  I saw him yesterday and gave him brief prescription of tramadol but in light of the above finding decided to get pharmacist not to fill the prescription and to notify his PCP.  Then I will update him once a talk with Dr. Charlett Blake.  Will you pull his Narx report so I can review.

## 2017-09-10 NOTE — Telephone Encounter (Signed)
There was a miss communication on the pharmacy end. We have confirmed this was another patient.  He has not picked up medications in [place]

## 2017-09-10 NOTE — Telephone Encounter (Signed)
Please advise 

## 2017-09-10 NOTE — Telephone Encounter (Signed)
Pt. Called  asking for a muscle relaxer.

## 2017-09-10 NOTE — Telephone Encounter (Signed)
Due to controlled meds from out of state I can no longer prescribe controlled substances

## 2017-09-10 NOTE — Telephone Encounter (Addendum)
Pharmacy calling and wanting to make provider aware that another provider in another state is prescribing another pain med, percocet 10-325 and embeda 20-0.8. Please advise.ok to fill tramadol  Call back [phone]

## 2017-09-10 NOTE — Telephone Encounter (Signed)
Please see telephone note 09/09/17 From John Lane    We will not refill his meds

## 2017-09-10 NOTE — Telephone Encounter (Signed)
Thanks for following this up. This makes more sense

## 2017-09-10 NOTE — Telephone Encounter (Signed)
Rx tramadol sent to patient's pharmacy/[facility].

## 2017-09-10 NOTE — Telephone Encounter (Signed)
Prescription of Flexeril sent to patient's pharmacy.

## 2017-09-10 NOTE — Telephone Encounter (Signed)
No more controlled meds from [place]

## 2017-09-11 NOTE — Telephone Encounter (Signed)
Yes I did notify the pharmacy about the refill for tramadol

## 2017-09-11 NOTE — Telephone Encounter (Signed)
Copied from [place]. Topic: Quick Communication - Rx Refill/Question >> Sep 09, 2017  4:59 PM Clack, Laban Emperor wrote: Medication:  traMADol (ULTRAM) 50 MG tablet [[id number]]    Has the patient contacted their pharmacy? No. (pt would like to change pharmacies)   (Agent: If no, request that the patient contact the pharmacy for the refill.)   Preferred Pharmacy (with phone number or street name): [place], [place] - [address] [phone] (Phone) [phone] (Fax)  pt would like to change pharmacies    Agent: Please be advised that RX refills may take up to 3 business days. We ask that you follow-up with your pharmacy. >> Sep 10, 2017  3:38 PM Boyd Kerbs wrote: Jaci Standard from [place] called back.  He called the pharmacy in [place]. And spoke to them and got details of the pt. Named Ward Chatters with the same birthday.    It is not the same person..    The Ladarrell Cornwall in [place]. Is a Caucasian man and Nilton Lave here is a black man.  They checked several different things and Jaci Standard is sure they are not the same person.[name] Kitchen

## 2017-10-09 ENCOUNTER — Telehealth: Payer: Self-pay | Admitting: Medical

## 2017-10-09 NOTE — Telephone Encounter (Signed)
The patient's preparticipation gout physical exam form.  Notify patient that I am leaving it filed alphabetically so he can pick up form.

## 2017-10-28 ENCOUNTER — Other Ambulatory Visit: Payer: Self-pay | Admitting: Family Medicine

## 2017-10-30 ENCOUNTER — Telehealth: Payer: Self-pay | Admitting: Family Medicine

## 2017-10-30 NOTE — Telephone Encounter (Signed)
Copied from [place]. Topic: Quick Communication - See Telephone Encounter >> Oct 30, 2017  8:38 AM Ahmed Prima L wrote: CRM for notification. See Telephone encounter for: 10/30/17. ibuprofen (ADVIL,MOTRIN) 200 MG tablet bismuth subsalicylate (PEPTO BISMOL) 262 MG/15ML suspension  [facility], [place]

## 2017-11-02 MED ORDER — IBUPROFEN 200 MG PO TABS: 200.0000 mg | ORAL_TABLET | Freq: Three times a day (TID) | ORAL | 0 refills | Status: DC | PRN

## 2017-11-02 MED ORDER — BISMUTH SUBSALICYLATE 262 MG/15ML PO SUSP: 30.0000 mL | Freq: Four times a day (QID) | ORAL | 3 refills | Status: DC | PRN

## 2017-11-02 NOTE — Telephone Encounter (Signed)
Medication sent to pharmacy  

## 2018-03-31 ENCOUNTER — Telehealth: Payer: Self-pay | Admitting: Family Medicine

## 2018-03-31 NOTE — Telephone Encounter (Signed)
Copied from [place] [phone]. Topic: Quick Communication - See Telephone Encounter >> Mar 31, 2018 11:53 AM Genella Rife H wrote: CRM for notification. See Telephone encounter for: 03/31/18.  Left voicemail appt needs to be rescheduled per pcp.

## 2018-04-08 ENCOUNTER — Other Ambulatory Visit: Payer: Self-pay | Admitting: Family Medicine

## 2018-04-16 ENCOUNTER — Encounter: Payer: Managed Care, Other (non HMO) | Admitting: Family Medicine

## 2018-05-10 ENCOUNTER — Encounter: Payer: Self-pay | Admitting: Family Medicine

## 2018-05-10 ENCOUNTER — Ambulatory Visit (INDEPENDENT_AMBULATORY_CARE_PROVIDER_SITE_OTHER): Payer: Managed Care, Other (non HMO) | Admitting: Family Medicine

## 2018-05-10 DIAGNOSIS — E7849 Other hyperlipidemia: Secondary | ICD-10-CM

## 2018-05-10 DIAGNOSIS — Z0000 Encounter for general adult medical examination without abnormal findings: Secondary | ICD-10-CM

## 2018-05-10 DIAGNOSIS — Z8546 Personal history of malignant neoplasm of prostate: Secondary | ICD-10-CM

## 2018-05-10 DIAGNOSIS — Z86010 Personal history of colonic polyps: Secondary | ICD-10-CM | POA: Diagnosis not present

## 2018-05-10 DIAGNOSIS — Z23 Encounter for immunization: Secondary | ICD-10-CM

## 2018-05-10 LAB — LIPID PANEL
Cholesterol: 181 mg/dL (ref 0–200)
HDL: 48.9 mg/dL (ref >39.00)
LDL Cholesterol: 109 mg/dL — ABNORMAL HIGH (ref 0–99)
NONHDL: 131.71
Total CHOL/HDL Ratio: 4
Triglycerides: 114 mg/dL (ref 0.0–149.0)
VLDL: 22.8 mg/dL (ref 0.0–40.0)

## 2018-05-10 LAB — CBC
HCT: 45.4 % (ref 39.0–52.0)
HEMOGLOBIN: 15.7 g/dL (ref 13.0–17.0)
MCHC: 34.6 g/dL (ref 30.0–36.0)
MCV: 89.6 fl (ref 78.0–100.0)
PLATELETS: 201 10*3/uL (ref 150.0–400.0)
RBC: 5.07 Mil/uL (ref 4.22–5.81)
RDW: 12.6 % (ref 11.5–15.5)
WBC: 7.2 10*3/uL (ref 4.0–10.5)

## 2018-05-10 LAB — COMPREHENSIVE METABOLIC PANEL
ALK PHOS: 54 U/L (ref 39–117)
ALT: 20 U/L (ref 0–53)
AST: 22 U/L (ref 0–37)
Albumin: 4.3 g/dL (ref 3.5–5.2)
BILIRUBIN TOTAL: 0.6 mg/dL (ref 0.2–1.2)
BUN: 19 mg/dL (ref 6–23)
CO2: 30 mEq/L (ref 19–32)
Calcium: 9.3 mg/dL (ref 8.4–10.5)
Chloride: 101 mEq/L (ref 96–112)
Creatinine, Ser: 0.99 mg/dL (ref 0.40–1.50)
GFR: 100.03 mL/min (ref >60.00)
GLUCOSE: 85 mg/dL (ref 70–99)
Potassium: 4.5 mEq/L (ref 3.5–5.1)
Sodium: 137 mEq/L (ref 135–145)
TOTAL PROTEIN: 6.9 g/dL (ref 6.0–8.3)

## 2018-05-10 LAB — TSH: TSH: 1.41 u[IU]/mL (ref 0.35–4.50)

## 2018-05-10 MED ORDER — KRILL OIL 500 MG PO CAPS: 500.0000 mg | ORAL_CAPSULE | Freq: Every day | ORAL | 6 refills | Status: DC

## 2018-05-10 NOTE — Assessment & Plan Note (Signed)
Continues to struggle with knee pain and is requesting a note for work saying he needs to wear sneakers. He reports this helps with his pain

## 2018-05-10 NOTE — Assessment & Plan Note (Signed)
Last colonoscopy 2018 with non adenomatous polyp, repeat in 2023

## 2018-05-10 NOTE — Patient Instructions (Signed)
[place]  Shingrix is the new shingles 2 shots over 2-6 months, check with insurance regarding coverage, document   Preventive Care 40-64 Years, Male Preventive care refers to lifestyle choices and visits with your health care provider that can promote health and wellness. What does preventive care include?  A yearly physical exam. This is also called an annual well check.  Dental exams once or twice a year.  Routine eye exams. Ask your health care provider how often you should have your eyes checked.  Personal lifestyle choices, including: ? Daily care of your teeth and gums. ? Regular physical activity. ? Eating a healthy diet. ? Avoiding tobacco and drug use. ? Limiting alcohol use. ? Practicing safe sex. ? Taking low-dose aspirin every day starting at age 69. What happens during an annual well check? The services and screenings done by your health care provider during your annual well check will depend on your age, overall health, lifestyle risk factors, and family history of disease. Counseling Your health care provider may ask you questions about your:  Alcohol use.  Tobacco use.  Drug use.  Emotional well-being.  Home and relationship well-being.  Sexual activity.  Eating habits.  Work and work Statistician.  Screening You may have the following tests or measurements:  Height, weight, and BMI.  Blood pressure.  Lipid and cholesterol levels. These may be checked every 5 years, or more frequently if you are over 75 years old.  Skin check.  Lung cancer screening. You may have this screening every year starting at age 52 if you have a 30-pack-year history of smoking and currently smoke or have quit within the past 15 years.  Fecal occult blood test (FOBT) of the stool. You may have this test every year starting at age 34.  Flexible sigmoidoscopy or colonoscopy. You may have a sigmoidoscopy every 5  years or a colonoscopy every 10 years starting at age 60.  Prostate cancer screening. Recommendations will vary depending on your family history and other risks.  Hepatitis C blood test.  Hepatitis B blood test.  Sexually transmitted disease (STD) testing.  Diabetes screening. This is done by checking your blood sugar (glucose) after you have not eaten for a while (fasting). You may have this done every 1-3 years.  Discuss your test results, treatment options, and if necessary, the need for more tests with your health care provider. Vaccines Your health care provider may recommend certain vaccines, such as:  Influenza vaccine. This is recommended every year.  Tetanus, diphtheria, and acellular pertussis (Tdap, Td) vaccine. You may need a Td booster every 10 years.  Varicella vaccine. You may need this if you have not been vaccinated.  Zoster vaccine. You may need this after age 40.  Measles, mumps, and rubella (MMR) vaccine. You may need at least one dose of MMR if you were born in 1957 or later. You may also need a second dose.  Pneumococcal 13-valent conjugate (PCV13) vaccine. You may need this if you have certain conditions and have not been vaccinated.  Pneumococcal polysaccharide (PPSV23) vaccine. You may need one or two doses if you smoke cigarettes or if you have certain conditions.  Meningococcal vaccine. You may need this if you have certain conditions.  Hepatitis A vaccine. You may need this if you have certain conditions or if you travel or work in places where you may be exposed to hepatitis A.  Hepatitis B vaccine. You may need this  if you have certain conditions or if you travel or work in places where you may be exposed to hepatitis B.  Haemophilus influenzae type b (Hib) vaccine. You may need this if you have certain risk factors.  Talk to your health care provider about which screenings and vaccines you need and how often you need them. This information is not  intended to replace advice given to you by your health care provider. Make sure you discuss any questions you have with your health care provider. Document Released: 08/17/2015 Document Revised: 04/09/2016 Document Reviewed: 05/22/2015 Elsevier Interactive Patient Education  [employer].

## 2018-05-10 NOTE — Assessment & Plan Note (Signed)
Encouraged heart healthy diet, increase exercise, avoid trans fats, consider a krill oil cap daily 

## 2018-05-10 NOTE — Assessment & Plan Note (Signed)
Patient encouraged to maintain heart healthy diet, regular exercise, adequate sleep. Consider daily probiotics. Take medications as prescribed. Given and reviewed copy of ACP documents from [place] Secretary of State and encouraged to complete and return 

## 2018-05-10 NOTE — Assessment & Plan Note (Signed)
Follows with urology and has seen them in past 6 months, Dr Sabra Heck, doing well

## 2018-05-10 NOTE — Progress Notes (Signed)
Subjective:    Patient ID: John Lane, male    DOB: [birth year], 57 y.o.   MRN: [id number]  No chief complaint on file.   HPI Patient is in today for annual preventative exam and follow up on chronic concerns. He feels well today, no recent febrile illness or hospitalizations. He is asking for a note for work to use sneakers due to foot and knee pain. No recent trauma or pain. Is staying active and amintains a heart healthy diet. Denies CP/palp/SOB/HA/congestion/fevers/GI or GU c/o. Taking meds as prescribed  Past Medical History:  Diagnosis Date  . Allergic state 02/11/2015  . Allergy   . H/O hematuria    2003  . H/O prostate cancer 08/28/2011  . Other and unspecified hyperlipidemia 01/18/2014  . 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 APPENDECTOMY    . CIRCUMCISION  05/12/2012   Procedure: CIRCUMCISION ADULT;  Surgeon: Bernestine Amass, MD;  Location: [facility];  Service: Urology;  Laterality: N/A;  . ROBOT ASSISTED LAPAROSCOPIC RADICAL PROSTATECTOMY  05/12/2012   Procedure: ROBOTIC ASSISTED LAPAROSCOPIC RADICAL PROSTATECTOMY;  Surgeon: Bernestine Amass, MD;  Location: [facility];  Service: Urology;  Laterality: N/A;  . TONSILLECTOMY      Family History  Problem Relation Age of Onset  . Heart disease Brother        MI s/p stent  . Diabetes Father   . Hypertension Father   . Heart disease Father   . Arthritis Father   . Hypertension Mother   . Stroke Mother 61       left arm weakness, speech improving,   . Gout Mother   . Arthritis Sister   . Arthritis Sister   . Cancer Sister   . Hypertension Brother   . Hearing loss Brother   . Hearing loss Son   . Heart disease Brother        MI  . Hypertension Brother   . Colon cancer Neg Hx   . Esophageal cancer Neg Hx   . Rectal cancer Neg Hx   . Stomach cancer Neg Hx     Social History   Socioeconomic History  . Marital status: Married    Spouse name:  Not on file  . Number of children: Not on file  . Years of education: Not on file  . Highest education level: Not on file  Occupational History  . Not on file  Social Needs  . Financial resource strain: Not on file  . Food insecurity:    Worry: Not on file    Inability: Not on file  . Transportation needs:    Medical: Not on file    Non-medical: Not on file  Tobacco Use  . Smoking status: Former Smoker    Packs/day: 0.50    Years: 10.00    Pack years: 5.00    Last attempt to quit: 08/05/1991    Years since quitting: 26.7  . Smokeless tobacco: Never Used  Substance and Sexual Activity  . Alcohol use: Yes    Alcohol/week: 2.0 standard drinks    Types: 2 Standard drinks or equivalent per week    Comment: 1-2 weekly  . Drug use: No  . 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 Physical activity:    Days per week: Not on file  Minutes per session: Not on file  . Stress: Not on file  Relationships  . Social connections:    Talks on phone: Not on file    Gets together: Not on file    Attends religious service: Not on file    Active member of club or organization: Not on file    Attends meetings of clubs or organizations: Not on file    Relationship status: Not on file  . Intimate partner violence:    Fear of current or ex partner: Not on file    Emotionally abused: Not on file    Physically abused: Not on file    Forced sexual activity: Not on file  Other Topics Concern  . Not on file  Social History Narrative   Married   Former smoker   Quit over 18 years ago-smoked for 10 years 1/2 ppd   3 children    Outpatient Medications Prior to Visit  Medication Sig Dispense Refill  . Acetaminophen (TYLENOL ARTHRITIS EXT RELIEF PO) Take by mouth.    [name] Kitchen acetaminophen (TYLENOL) 500 MG tablet Take 1 tablet (500 mg total) by mouth 3 (three) times daily as needed. 90 tablet 5  . BAYER  ASPIRIN EC LOW DOSE 81 MG EC tablet TAKE ONE TABLET BY MOUTH ONCE DAILY AS NEEDED FOR PAIN 400 tablet 0  . bismuth subsalicylate (PEPTO BISMOL) 262 MG/15ML suspension Take 30 mLs by mouth every 6 (six) hours as needed. 360 mL 3  . budesonide (RHINOCORT AQUA) 32 MCG/ACT nasal spray Place 1 spray into both nostrils daily. 8.6 g 5  . Camphor-Menthol-Methyl Sal 1.2-5.7-6.3 % PTCH Apply 1 patch topically 2 (two) times daily. 40 patch 1  . Cholecalciferol (VITAMIN D PO) Take by mouth.    . cyclobenzaprine (FLEXERIL) 10 MG tablet Take 1 tablet (10 mg total) by mouth at bedtime. 10 tablet 0  . fluticasone (FLONASE) 50 MCG/ACT nasal spray Place 2 sprays into both nostrils daily. 47.4 g 3  . ibuprofen (ADVIL,MOTRIN) 200 MG tablet Take 1 tablet (200 mg total) by mouth 3 (three) times daily as needed. Advil Liqui-gel 240 tablet 0  . Multiple Vitamins-Minerals (AIRBORNE GUMMIES PO)     . naproxen sodium (ANAPROX) 220 MG tablet TAKE ONE CAPSULE BY MOUTH TWICE DAILY AS NEEDED 160 tablet 6  . olopatadine (PATANOL) 0.1 % ophthalmic solution INSTILL ONE DROP INTO EACH EYE TWICE DAILY AS NEEDED 5 mL 1  . Saline (SIMPLY SALINE) 0.9 % AERS Place 1 spray into the nose daily. 378 mL 2  . sildenafil (VIAGRA) 100 MG tablet Take 0.5-1 tablets (50-100 mg total) by mouth daily as needed for erectile dysfunction. 9 tablet 3  . traMADol (ULTRAM) 50 MG tablet Take 1 tablet (50 mg total) by mouth every 8 (eight) hours as needed. 15 tablet 0  . triamcinolone (NASACORT ALLERGY 24HR) 55 MCG/ACT AERO nasal inhaler Place 2 sprays into the nose daily. 1 Inhaler 12  . Turmeric Curcumin 500 MG CAPS Take 500 mg by mouth daily. 250 capsule 1  . ZYRTEC-D ALLERGY & CONGESTION 5-120 MG tablet TAKE 1 TABLET BY MOUTH ONCE DAILY AS NEEDED FOR ALLERGIES 24 tablet 2  . Krill Oil 500 MG CAPS Take 1 capsule (500 mg total) by mouth daily. 160 capsule 6   No facility-administered medications prior to visit.     No Known Allergies  Review of  Systems  Constitutional: Negative for chills, fever and malaise/fatigue.  HENT: Negative for congestion and hearing loss.   Eyes:  Negative for discharge.  Respiratory: Negative for cough, sputum production and shortness of breath.   Cardiovascular: Negative for chest pain, palpitations and leg swelling.  Gastrointestinal: Negative for abdominal pain, blood in stool, constipation, diarrhea, heartburn, nausea and vomiting.  Genitourinary: Negative for dysuria, frequency, hematuria and urgency.  Musculoskeletal: Positive for joint pain. Negative for back pain, falls and myalgias.  Skin: Negative for rash.  Neurological: Negative for dizziness, sensory change, loss of consciousness, weakness and headaches.  Endo/Heme/Allergies: Negative for environmental allergies. Does not bruise/bleed easily.  Psychiatric/Behavioral: Negative for depression and suicidal ideas. The patient is not nervous/anxious and does not have insomnia.        Objective:    Physical Exam  Constitutional: He is oriented to person, place, and time. He appears well-developed and well-nourished. No distress.  HENT:  Head: Normocephalic and atraumatic.  Right Ear: External ear normal.  Left Ear: External ear normal.  Nose: Nose normal.  Mouth/Throat: Oropharynx is clear and moist.  Eyes: Pupils are equal, round, and reactive to light. Conjunctivae and EOM are normal. Right eye exhibits no discharge. Left eye exhibits no discharge.  Neck: Normal range of motion. Neck supple.  Cardiovascular: Normal rate and regular rhythm.  No murmur heard. Pulmonary/Chest: Effort normal and breath sounds normal. He has no wheezes.  Abdominal: Soft. Bowel sounds are normal. There is no tenderness.  Musculoskeletal: He exhibits no edema.  Neurological: He is alert and oriented to person, place, and time. He displays normal reflexes. No cranial nerve deficit.  Skin: Skin is warm and dry.  Psychiatric: He has a normal mood and affect.    Nursing note and vitals reviewed.   BP 120/82 (BP Location: Left Arm, Patient Position: Sitting, Cuff Size: Normal)   Pulse 84   Temp 97.9 F (36.6 C) (Oral)   Resp 18   Ht 5\' 5"  (1.651 m)   Wt 150 lb 9.6 oz (68.3 kg)   SpO2 98%   BMI 25.06 kg/m  Wt Readings from Last 3 Encounters:  05/10/18 150 lb 9.6 oz (68.3 kg)  09/09/17 150 lb 12.8 oz (68.4 kg)  07/15/17 151 lb (68.5 kg)     Lab Results  Component Value Date   WBC 6.6 04/13/2017   HGB 16.0 04/13/2017   HCT 46.7 04/13/2017   PLT 208.0 04/13/2017   GLUCOSE 88 04/13/2017   CHOL 198 04/13/2017   TRIG 126.0 04/13/2017   HDL 48.30 04/13/2017   LDLDIRECT 99.0 02/06/2015   LDLCALC 124 (H) 04/13/2017   ALT 19 04/13/2017   AST 19 04/13/2017   NA 140 04/13/2017   K 4.7 04/13/2017   CL 102 04/13/2017   CREATININE 0.92 04/13/2017   BUN 14 04/13/2017   CO2 30 04/13/2017   TSH 1.30 04/13/2017   PSA 0.01 (L) 04/13/2017   MICROALBUR 1.3 04/14/2008    Lab Results  Component Value Date   TSH 1.30 04/13/2017   Lab Results  Component Value Date   WBC 6.6 04/13/2017   HGB 16.0 04/13/2017   HCT 46.7 04/13/2017   MCV 90.5 04/13/2017   PLT 208.0 04/13/2017   Lab Results  Component Value Date   NA 140 04/13/2017   K 4.7 04/13/2017   CO2 30 04/13/2017   GLUCOSE 88 04/13/2017   BUN 14 04/13/2017   CREATININE 0.92 04/13/2017   BILITOT 0.6 04/13/2017   ALKPHOS 51 04/13/2017   AST 19 04/13/2017   ALT 19 04/13/2017   PROT 6.8 04/13/2017   ALBUMIN 4.3 04/13/2017  CALCIUM 9.8 04/13/2017   GFR 109.28 04/13/2017   Lab Results  Component Value Date   CHOL 198 04/13/2017   Lab Results  Component Value Date   HDL 48.30 04/13/2017   Lab Results  Component Value Date   LDLCALC 124 (H) 04/13/2017   Lab Results  Component Value Date   TRIG 126.0 04/13/2017   Lab Results  Component Value Date   CHOLHDL 4 04/13/2017   No results found for: HGBA1C     Assessment & Plan:   Problem List Items Addressed This  Visit    Preventative health care    Patient encouraged to maintain heart healthy diet, regular exercise, adequate sleep. Consider daily probiotics. Take medications as prescribed.  Given and reviewed copy of ACP documents from [facility] Secretary of State and encouraged to complete and return      Relevant Orders   CBC   Comprehensive metabolic panel   Lipid panel   TSH   H/O prostate cancer    Follows with urology and has seen them in past 6 months, Dr Sabra Heck, doing well      Hyperlipidemia    Encouraged heart healthy diet, increase exercise, avoid trans fats, consider a krill oil cap daily      Relevant Orders   Lipid panel        Meds ordered this encounter  Medications  . Krill Oil 500 MG CAPS    Sig: Take 1 capsule (500 mg total) by mouth daily.    Dispense:  160 capsule    Refill:  6   Penni Homans, MD

## 2018-07-06 ENCOUNTER — Other Ambulatory Visit: Payer: Self-pay

## 2018-07-06 NOTE — Telephone Encounter (Signed)
Copied from [place] [id number]. Topic: General - Other >> Jul 06, 2018 11:07 AM Oneta Rack wrote: Relation to pt: self  Call back number: [phone] Pharmacy:  [facility], [place] [phone] (Phone) [phone] (Fax)  Reason for call:  Patient requesting Rx for gnc mega men performance & vitality  30 packs because he would like to use his health savings card therefore prescription is needed.   -PCP hand wrote RX  rx faxed over to [facility] pharmacy w/ confirmation

## 2018-07-14 NOTE — Telephone Encounter (Signed)
Unable to locate in Epic Will forward to [place] to do when returns.

## 2018-07-14 NOTE — Telephone Encounter (Signed)
Pt at [facility] and they do not have the Rx for this med. [place] Mega Men Performance & Vitality 30 paks Pt aware Princess out on Wed. Pt asked if you can resend to [facility]  and call him tomorrow, Thursday 07/15/18. [facility] asked if it could be sent electronically, and I advised I did not know.   [place], [place] [phone] (Phone) [phone] (Fax)

## 2018-07-15 ENCOUNTER — Other Ambulatory Visit: Payer: Self-pay | Admitting: Family Medicine

## 2018-07-15 MED ORDER — GNP MEGA MULTI FOR MEN PO TABS: ORAL_TABLET | ORAL | 1 refills | Status: DC

## 2018-07-15 NOTE — Telephone Encounter (Signed)
Sent medication in

## 2018-07-15 NOTE — Telephone Encounter (Signed)
Requested medication (s) are due for refill today: Yes  Requested medication (s) are on the active medication list: Yes  Last refill:  06/24/17  Future visit scheduled: No  Notes to clinic:  Unable to refill, expired Rx     Requested Prescriptions  Pending Prescriptions Disp Refills   Multiple Vitamins-Minerals (GNP MEGA MULTI FOR MEN) TABS 30 tablet 1    Sig: [employer] Mega Men Performance & Vitality 30 packs. Take as directed     There is no refill protocol information for this order     Camphor-Menthol-Methyl Sal 1.2-5.7-6.3 % PTCH 40 patch 1    Sig: Apply 1 patch topically 2 (two) times daily.     Over the Counter:  OTC Passed - 07/15/2018  2:40 PM      Passed - Valid encounter within last 12 months    Recent Outpatient Visits          2 months ago Other hyperlipidemia   [facility] at [place], [place]   10 months ago Pain in pelvis   [facility] at [place], [place]   1 year ago Needs flu shot   [facility] at [place], [place]   2 years ago Preventative health care   [employer] at [place], [place]   3 years ago Preventative health care   [place] [facility] [place], [place]      Future Appointments            In 10 months Mosie Lukes, MD [facility] at [place]

## 2018-07-15 NOTE — Telephone Encounter (Signed)
Copied from [place] [phone]. Topic: General - Other >> Jul 15, 2018 12:22 PM Hewitt Shorts wrote: Pt is calling to check on status of refill request for [facility] mens performance and vitality 30 pk to [facility]  And also would like to add to that the refill request on camphor=menthol-methyl sal and he states does it make a difference that the one for [facility] has ladicane in it -he states he is asking for this to be able to use his flexible spending card at [facility]

## 2018-07-15 NOTE — Telephone Encounter (Signed)
Copied from [place] [phone]. Topic: Quick Communication - See Telephone Encounter >> Jul 15, 2018 11:31 AM Marja Kays F wrote: Pt is calling stating that she is needing to [facility] due to [facility] not covering them any more at previous pharmacy  Please ger new rx's for xanax, amlodipine,estradiol, levothyroxin, losartan,meloxicam,metformin,metoprolol, mupirocin ontment, protonix, sotalol, spironolactone, tempaxepam, venlafaxine,and warfarin  [facility] [phone] contact person [place] ext [phone]    Best number for pt [phone]   This message was done in error disregard

## 2018-07-15 NOTE — Addendum Note (Signed)
Addended by: Magdalene Molly A on: 07/15/2018 02:26 PM   Modules accepted: Orders

## 2018-07-16 MED ORDER — GNP MEGA MULTI FOR MEN PO TABS: ORAL_TABLET | ORAL | 1 refills | Status: AC

## 2018-07-16 MED ORDER — CAMPHOR-MENTHOL-METHYL SAL 1.2-5.7-6.3 % EX PTCH: 1.0000 | MEDICATED_PATCH | Freq: Two times a day (BID) | CUTANEOUS | 1 refills | Status: DC

## 2018-08-23 ENCOUNTER — Ambulatory Visit (INDEPENDENT_AMBULATORY_CARE_PROVIDER_SITE_OTHER): Payer: BLUE CROSS/BLUE SHIELD | Admitting: Family Medicine

## 2018-08-23 ENCOUNTER — Encounter: Payer: Self-pay | Admitting: Family Medicine

## 2018-08-23 VITALS — BP 132/90 | HR 99 | Temp 98.2°F | Ht 65.0 in | Wt 151.2 lb

## 2018-08-23 DIAGNOSIS — J069 Acute upper respiratory infection, unspecified: Secondary | ICD-10-CM | POA: Diagnosis not present

## 2018-08-23 MED ORDER — ACETAMINOPHEN 500 MG PO TABS: 500.0000 mg | ORAL_TABLET | Freq: Three times a day (TID) | ORAL | 5 refills | Status: AC | PRN

## 2018-08-23 MED ORDER — METHYLPREDNISOLONE ACETATE 80 MG/ML IJ SUSP
80.0000 mg | Freq: Once | INTRAMUSCULAR | Status: AC
  Administered 2018-08-23: 80 mg via INTRAMUSCULAR

## 2018-08-23 NOTE — Progress Notes (Signed)
Pre visit review using our clinic review tool, if applicable. No additional management support is needed unless otherwise documented below in the visit note. 

## 2018-08-23 NOTE — Progress Notes (Signed)
Chief Complaint  Patient presents with  . Sinusitis    John Lane here for URI complaints.  Duration: 1 week  Associated symptoms: sinus congestion, sinus pain, ear fullness, shortness of breath, chest tightness and cough Denies: rhinorrhea, itchy watery eyes, ear drainage, sore throat, wheezing, myalgia and fevers Treatment to date: Mucinex, cough drops Sick contacts: Yes - wife was ill prior to him getting sick  ROS:  Const: Denies fevers HEENT: As noted in HPI Lungs: +cough  Past Medical History:  Diagnosis Date  . Allergic state 02/11/2015  . Allergy   . H/O hematuria    2003  . H/O prostate cancer 08/28/2011  . Other and unspecified hyperlipidemia 01/18/2014  . Prostate cancer ([place]) 12/24/11   Gleason 3+4=7, volume 15 gm    BP 132/90 (BP Location: Left Arm, Patient Position: Sitting, Cuff Size: Normal)   Pulse 99   Temp 98.2 F (36.8 C) (Oral)   Ht 5\' 5"  (1.651 m)   Wt 151 lb 4 oz (68.6 kg)   SpO2 96%   BMI 25.17 kg/m  General: Awake, alert, appears stated age HEENT: AT, [place], ears patent b/l and TM's neg, nares patent w/o discharge, no sinus ttp. pharynx pink and without exudates, MMM Neck: No masses or asymmetry Heart: RRR Lungs: CTAB, no accessory muscle use Psych: Age appropriate judgment and insight, normal mood and affect  URI with cough and congestion  Likely viral. Depo IM today to help with tightness/sinus pressure.  Continue to push fluids, practice good hand hygiene, cover mouth when coughing. F/u prn. Send MyChart message in 2 d if no better. If starting to experience fevers, shaking, or shortness of breath, seek immediate care. Pt voiced understanding and agreement to the plan.  [place], DO 08/23/18 4:26 PM

## 2018-08-23 NOTE — Patient Instructions (Addendum)
Continue to push fluids, practice good hand hygiene, and cover your mouth if you cough.  If you start having fevers, shaking or shortness of breath, seek immediate care.  Continue the Mucinex for now.  Send me a MyChart message on Wednesday mid morning if you are not feeling better. If you are, no need to reach out.  For symptoms, consider using Vick's VapoRub on chest or under nose, air humidifier, Benadryl at night, and elevating the head of the bed. Tylenol and ibuprofen for aches and pains you may be experiencing.   Let us know if you need anything.

## 2018-08-23 NOTE — Addendum Note (Signed)
Addended by: Sharon Seller B on: 08/23/2018 04:34 PM   Modules accepted: Orders

## 2018-08-25 ENCOUNTER — Telehealth: Payer: Self-pay | Admitting: Family Medicine

## 2018-08-25 MED ORDER — AZITHROMYCIN 250 MG PO TABS: ORAL_TABLET | ORAL | 0 refills | Status: DC

## 2018-08-25 NOTE — Telephone Encounter (Signed)
Copied from [place] [phone]. Topic: Quick Communication - See Telephone Encounter >> Aug 25, 2018 11:35 AM Burchel, Abbi R wrote: CRM for notification. See Telephone encounter for: 08/25/18.  Pt states he was instructed to call back and f/up if he was not feeling any better by today.  Pt states he is not feeling better and would like for Dr Nani Ravens to call in abx as they discussed during last OV.  Please advise  [phone]

## 2018-08-25 NOTE — Telephone Encounter (Signed)
Please check with patient regarding current symptoms so we can decide about possible prescription

## 2018-08-26 NOTE — Telephone Encounter (Signed)
Patient received antibiotic on 08/25/2018

## 2018-09-10 ENCOUNTER — Ambulatory Visit: Payer: Self-pay

## 2018-09-10 NOTE — Telephone Encounter (Signed)
Pt called wanting the doctor to prescribe cough medication.  He states he was treated 3 weeks ago for sinus and cough. He still has the cough.. He states that the cough is occasional but when he coughs it kind of takes his breath away. He describes it as having a throat full of phlegm. He says he does not cough up anything. He feels SOB with the cough. He has no other  Symptoms. Appointment scheduled per protocol.  Care advice read to patient. Patient verbalized understanding of all instructions.  Reason for Disposition . Cough has been present for > 3 weeks  Answer Assessment - Initial Assessment Questions 1. ONSET: "When did the cough begin?"      About 3 weeks ago 2. SEVERITY: "How bad is the cough today?"      ocassional 3. RESPIRATORY DISTRESS: "Describe your breathing."      kinda SOB cough takes breath away 4. FEVER: "Do you have a fever?" If so, ask: "What is your temperature, how was it measured, and when did it start?"     no 5. HEMOPTYSIS: "Are you coughing up any blood?" If so ask: "How much?" (flecks, streaks, tablespoons, etc.)     no 6. TREATMENT: "What have you done so far to treat the cough?" (e.g., meds, fluids, humidifier)    dimetap mucinex  7. CARDIAC HISTORY: "Do you have any history of heart disease?" (e.g., heart attack, congestive heart failure)      no 8. LUNG HISTORY: "Do you have any history of lung disease?"  (e.g., pulmonary embolus, asthma, emphysema)     no 9. PE RISK FACTORS: "Do you have a history of blood clots?" (or: recent major surgery, recent prolonged travel, bedridden)     no 10. OTHER SYMPTOMS: "Do you have any other symptoms? (e.g., runny nose, wheezing, chest pain)       no 11. PREGNANCY: "Is there any chance you are pregnant?" "When was your last menstrual period?"      N/A 12. TRAVEL: "Have you traveled out of the country in the last month?" (e.g., travel history, exposures)      No  Protocols used: COUGH - ACUTE NON-PRODUCTIVE-A-AH

## 2018-09-13 ENCOUNTER — Ambulatory Visit (INDEPENDENT_AMBULATORY_CARE_PROVIDER_SITE_OTHER): Payer: BLUE CROSS/BLUE SHIELD | Admitting: Family Medicine

## 2018-09-13 ENCOUNTER — Encounter: Payer: Self-pay | Admitting: Family Medicine

## 2018-09-13 ENCOUNTER — Telehealth: Payer: Self-pay | Admitting: Family Medicine

## 2018-09-13 VITALS — BP 120/86 | HR 96 | Temp 98.0°F | Ht 65.0 in | Wt 148.4 lb

## 2018-09-13 DIAGNOSIS — R05 Cough: Secondary | ICD-10-CM

## 2018-09-13 DIAGNOSIS — R058 Other specified cough: Secondary | ICD-10-CM

## 2018-09-13 MED ORDER — BENZONATATE 100 MG PO CAPS: 100.0000 mg | ORAL_CAPSULE | Freq: Three times a day (TID) | ORAL | 0 refills | Status: DC | PRN

## 2018-09-13 MED ORDER — FLUTICASONE PROPIONATE HFA 110 MCG/ACT IN AERO: 2.0000 | INHALATION_SPRAY | Freq: Two times a day (BID) | RESPIRATORY_TRACT | 1 refills | Status: DC

## 2018-09-13 NOTE — Telephone Encounter (Signed)
Copied from [place] [id number]. Topic: Quick Communication - See Telephone Encounter >> Sep 13, 2018  4:51 PM Bea Graff, NT wrote: CRM for notification. See Telephone encounter for: 09/13/18. Pt states that [facility] was out of network and the fluticasone (FLOVENT HFA) 110 MCG/ACT inhaler was over $200 and [facility].

## 2018-09-13 NOTE — Progress Notes (Signed)
Chief Complaint  Patient presents with  . Cough  . Shortness of Breath    John Lane here for URI complaints.  Duration: 1 month  Associated symptoms: dry cough and assoc sob Denies: sinus congestion, sinus pain, rhinorrhea, itchy watery eyes, red eyes, ear fullness, ear pain, ear drainage, sore throat, wheezing, myalgia and fevers Treatment to date: Zpak helped, lingering cough, Zyrtec Sick contacts: No  ROS:  Const: Denies fevers HEENT: As noted in HPI Lungs: No SOB  Past Medical History:  Diagnosis Date  . Allergic state 02/11/2015  . Allergy   . H/O hematuria    2003  . H/O prostate cancer 08/28/2011  . Other and unspecified hyperlipidemia 01/18/2014  . Prostate cancer ([place]) 12/24/11   Gleason 3+4=7, volume 15 gm    BP 120/86 (BP Location: Right Arm, Patient Position: Sitting, Cuff Size: Normal)   Pulse 96   Temp 98 F (36.7 C) (Oral)   Ht 5\' 5"  (1.651 m)   Wt 148 lb 6 oz (67.3 kg)   SpO2 97%   BMI 24.69 kg/m  General: Awake, alert, appears stated age HEENT: AT, [place], ears patent b/l and TM's neg, nares patent w/o discharge, pharynx pink and without exudates, MMM Neck: No masses or asymmetry Heart: RRR Lungs: CTAB, no accessory muscle use Psych: Age appropriate judgment and insight, normal mood and affect  Post-viral cough syndrome - Plan: benzonatate (TESSALON) 100 MG capsule, fluticasone (FLOVENT HFA) 110 MCG/ACT inhaler  Cough is lingering. Offered CXR, but likely lower yield. Will ck with ins.  F/u prn. If starting to experience fevers, shaking, or shortness of breath, seek immediate care. Pt voiced understanding and agreement to the plan.  [place], DO 09/13/18 4:17 PM

## 2018-09-13 NOTE — Patient Instructions (Signed)
This can last 4-6 weeks after the illness.  Continue to push fluids, practice good hand hygiene, and cover your mouth if you cough.  If you start having fevers, shaking or shortness of breath, seek immediate care.  Let me know if you are interested in a chest X-ray. I think it is low yield right now with unknown cost.  Let us know if you need anything.

## 2018-09-14 MED ORDER — FLUTICASONE PROPIONATE HFA 110 MCG/ACT IN AERO: 2.0000 | INHALATION_SPRAY | Freq: Two times a day (BID) | RESPIRATORY_TRACT | 1 refills | Status: DC

## 2018-09-14 NOTE — Telephone Encounter (Signed)
[facility]  

## 2018-10-05 ENCOUNTER — Other Ambulatory Visit: Payer: Self-pay | Admitting: Family Medicine

## 2018-10-05 DIAGNOSIS — R058 Other specified cough: Secondary | ICD-10-CM

## 2018-10-05 DIAGNOSIS — R05 Cough: Secondary | ICD-10-CM

## 2018-10-08 ENCOUNTER — Telehealth: Payer: Self-pay | Admitting: Family Medicine

## 2018-10-08 NOTE — Telephone Encounter (Signed)
Copied from [place] [phone]. Topic: General - Other >> Oct 08, 2018 11:31 AM Lennox Solders wrote: reason for CRM: pt needs new rxs on flonase nasal spray and salon pas patches. These med was last prescribed years ago. [facility] [address].

## 2018-10-12 MED ORDER — CAMPHOR-MENTHOL-METHYL SAL 1.2-5.7-6.3 % EX PTCH: 1.0000 | MEDICATED_PATCH | Freq: Two times a day (BID) | CUTANEOUS | 1 refills | Status: DC

## 2018-10-12 MED ORDER — FLUTICASONE PROPIONATE 50 MCG/ACT NA SUSP: 2.0000 | Freq: Every day | NASAL | 2 refills | Status: DC

## 2018-10-12 NOTE — Addendum Note (Signed)
Addended by: Magdalene Molly A on: 10/12/2018 09:31 AM   Modules accepted: Orders

## 2018-10-14 ENCOUNTER — Other Ambulatory Visit: Payer: Self-pay | Admitting: Family Medicine

## 2018-10-14 NOTE — Telephone Encounter (Signed)
Copied from [place] [id number]. Topic: Quick Communication - Rx Refill/Question >> Oct 14, 2018  5:21 PM Blase Mess A wrote: Medication:fluticasone [facility]) 50 MCG/ACT nasal spray [[id number]] -3 pack, Camphor-Menthol-Methyl Sal 1.2-5.7-6.3 % PTCH [[id number]] - can this be resent  Needed overcounter- Please advise   Has the patient contacted their pharmacy? Yes  (Agent: If no, request that the patient contact the pharmacy for the refill.) (Agent: If yes, when and what did the pharmacy advise?)  Preferred Pharmacy (with phone number or street name): [place], [place] - [place] [phone] (Phone) [phone] (Fax)    Agent: Please be advised that RX refills may take up to 3 business days. We ask that you follow-up with your pharmacy.

## 2018-10-15 ENCOUNTER — Telehealth: Payer: Self-pay

## 2018-10-15 ENCOUNTER — Other Ambulatory Visit: Payer: Self-pay

## 2018-10-15 MED ORDER — FLUTICASONE PROPIONATE 50 MCG/ACT NA SUSP: 2.0000 | Freq: Every day | NASAL | 3 refills | Status: DC

## 2018-10-15 NOTE — Telephone Encounter (Signed)
Copied from [place] [phone]. Topic: General - Other >> Oct 15, 2018  1:07 PM Leward Quan A wrote: Reason for CRM: Patient ask for a nurse to give him a call in reference to not getting the Rx he requested at the time they were requested. Ph#  [phone]     Left message for patient to call the office back. To let the office know what medication he needed to be refilled.  Nurse triage may handle

## 2018-10-15 NOTE — Telephone Encounter (Signed)
Rx's sent on 10/12/2018.

## 2018-11-01 ENCOUNTER — Other Ambulatory Visit: Payer: Self-pay | Admitting: Family Medicine

## 2018-11-05 ENCOUNTER — Other Ambulatory Visit: Payer: Self-pay | Admitting: Family Medicine

## 2018-11-05 DIAGNOSIS — R058 Other specified cough: Secondary | ICD-10-CM

## 2018-11-05 DIAGNOSIS — R05 Cough: Secondary | ICD-10-CM

## 2018-11-09 ENCOUNTER — Other Ambulatory Visit: Payer: Self-pay | Admitting: Family Medicine

## 2018-11-09 DIAGNOSIS — R05 Cough: Secondary | ICD-10-CM

## 2018-11-09 DIAGNOSIS — R058 Other specified cough: Secondary | ICD-10-CM

## 2018-12-01 ENCOUNTER — Other Ambulatory Visit: Payer: Self-pay | Admitting: Family Medicine

## 2018-12-08 ENCOUNTER — Other Ambulatory Visit: Payer: Self-pay | Admitting: Family Medicine

## 2018-12-28 ENCOUNTER — Other Ambulatory Visit: Payer: Self-pay | Admitting: Family Medicine

## 2019-03-01 ENCOUNTER — Other Ambulatory Visit: Payer: Self-pay | Admitting: Family Medicine

## 2019-04-06 DIAGNOSIS — C61 Malignant neoplasm of prostate: Secondary | ICD-10-CM | POA: Diagnosis not present

## 2019-04-06 DIAGNOSIS — R3121 Asymptomatic microscopic hematuria: Secondary | ICD-10-CM | POA: Diagnosis not present

## 2019-04-06 DIAGNOSIS — N393 Stress incontinence (female) (male): Secondary | ICD-10-CM | POA: Diagnosis not present

## 2019-04-06 DIAGNOSIS — N5231 Erectile dysfunction following radical prostatectomy: Secondary | ICD-10-CM | POA: Diagnosis not present

## 2019-04-21 ENCOUNTER — Other Ambulatory Visit: Payer: Self-pay | Admitting: Family Medicine

## 2019-05-11 ENCOUNTER — Other Ambulatory Visit: Payer: Self-pay

## 2019-05-12 ENCOUNTER — Encounter: Payer: Self-pay | Admitting: Medical

## 2019-05-12 ENCOUNTER — Ambulatory Visit (HOSPITAL_BASED_OUTPATIENT_CLINIC_OR_DEPARTMENT_OTHER)
Admission: RE | Admit: 2019-05-12 | Discharge: 2019-05-12 | Disposition: A | Discharge status: Home / Self Care | Payer: BC Managed Care – PPO | Source: Ambulatory Visit | Attending: Medical | Admitting: Medical

## 2019-05-12 ENCOUNTER — Ambulatory Visit (INDEPENDENT_AMBULATORY_CARE_PROVIDER_SITE_OTHER): Payer: BC Managed Care – PPO | Admitting: Medical

## 2019-05-12 ENCOUNTER — Encounter: Payer: BLUE CROSS/BLUE SHIELD | Admitting: Family Medicine

## 2019-05-12 VITALS — BP 130/85 | HR 66 | Temp 96.5°F | Resp 16 | Ht 65.0 in | Wt 145.4 lb

## 2019-05-12 DIAGNOSIS — G8929 Other chronic pain: Secondary | ICD-10-CM | POA: Insufficient documentation

## 2019-05-12 DIAGNOSIS — Z0000 Encounter for general adult medical examination without abnormal findings: Secondary | ICD-10-CM | POA: Diagnosis not present

## 2019-05-12 DIAGNOSIS — Z23 Encounter for immunization: Secondary | ICD-10-CM

## 2019-05-12 DIAGNOSIS — M25561 Pain in right knee: Secondary | ICD-10-CM | POA: Diagnosis not present

## 2019-05-12 DIAGNOSIS — Z113 Encounter for screening for infections with a predominantly sexual mode of transmission: Secondary | ICD-10-CM | POA: Diagnosis not present

## 2019-05-12 DIAGNOSIS — M1711 Unilateral primary osteoarthritis, right knee: Secondary | ICD-10-CM | POA: Diagnosis not present

## 2019-05-12 LAB — LIPID PANEL
Cholesterol: 174 mg/dL (ref 0–200)
HDL: 48.2 mg/dL (ref >39.00)
LDL Cholesterol: 108 mg/dL — ABNORMAL HIGH (ref 0–99)
NonHDL: 125.57
Total CHOL/HDL Ratio: 4
Triglycerides: 89 mg/dL (ref 0.0–149.0)
VLDL: 17.8 mg/dL (ref 0.0–40.0)

## 2019-05-12 LAB — COMPREHENSIVE METABOLIC PANEL
ALT: 18 U/L (ref 0–53)
AST: 20 U/L (ref 0–37)
Albumin: 4.2 g/dL (ref 3.5–5.2)
Alkaline Phosphatase: 56 U/L (ref 39–117)
BUN: 13 mg/dL (ref 6–23)
CO2: 29 mEq/L (ref 19–32)
Calcium: 9.4 mg/dL (ref 8.4–10.5)
Chloride: 103 mEq/L (ref 96–112)
Creatinine, Ser: 0.91 mg/dL (ref 0.40–1.50)
GFR: 103.36 mL/min (ref >60.00)
Glucose, Bld: 94 mg/dL (ref 70–99)
Potassium: 4.2 mEq/L (ref 3.5–5.1)
Sodium: 138 mEq/L (ref 135–145)
Total Bilirubin: 0.6 mg/dL (ref 0.2–1.2)
Total Protein: 6.6 g/dL (ref 6.0–8.3)

## 2019-05-12 LAB — CBC WITH DIFFERENTIAL/PLATELET
Basophils Absolute: 0 10*3/uL (ref 0.0–0.1)
Basophils Relative: 0.5 % (ref 0.0–3.0)
Eosinophils Absolute: 0.3 10*3/uL (ref 0.0–0.7)
Eosinophils Relative: 3.9 % (ref 0.0–5.0)
HCT: 44.7 % (ref 39.0–52.0)
Hemoglobin: 15.2 g/dL (ref 13.0–17.0)
Lymphocytes Relative: 50.8 % — ABNORMAL HIGH (ref 12.0–46.0)
Lymphs Abs: 3.5 10*3/uL (ref 0.7–4.0)
MCHC: 34.1 g/dL (ref 30.0–36.0)
MCV: 89.9 fl (ref 78.0–100.0)
Monocytes Absolute: 0.3 10*3/uL (ref 0.1–1.0)
Monocytes Relative: 4.8 % (ref 3.0–12.0)
Neutro Abs: 2.7 10*3/uL (ref 1.4–7.7)
Neutrophils Relative %: 40 % — ABNORMAL LOW (ref 43.0–77.0)
Platelets: 218 10*3/uL (ref 150.0–400.0)
RBC: 4.97 Mil/uL (ref 4.22–5.81)
RDW: 12.3 % (ref 11.5–15.5)
WBC: 6.8 10*3/uL (ref 4.0–10.5)

## 2019-05-12 NOTE — Progress Notes (Addendum)
Subjective:    Patient ID: John Lane, male    DOB: [birth year], 59 y.o.   MRN: [id number]  HPI   Pt in for cpe/wellness exam. He is fasting.  Pt states he has been exercising little bit. Doing some push ups and abdomen exercise. Pt states he walks a lot at work. He does not know how many steps he gets on average. Pt states moderate healthy diet. Avoid fried foods. Rare/random occasional beer. 2 cups of coffee a day. Non smoker  Pt will get flu vaccine today.  Pt ok with getting hiv screen.  Pt had hx of prostate cancer. Regularly followed by urologist. 2 months ago follow up with urologist he states psa was 0.  Up to date on colonoscopy.     Review of Systems  Constitutional: Negative for chills, fatigue and fever.  HENT: Negative for congestion, ear pain, nosebleeds, postnasal drip, sinus pressure, sneezing and sore throat.   Respiratory: Negative for cough, chest tightness, shortness of breath and wheezing.   Gastrointestinal: Negative for abdominal pain, diarrhea and nausea.  Genitourinary: Negative for dysuria and frequency.  Musculoskeletal: Negative for arthralgias, gait problem and myalgias.       Some knee pain. He states hurts when walks some times. Or if does quick exercise.  Rt side pain. 5 years minimal pain but notices.  Skin: Negative for rash.  Neurological: Negative for dizziness, seizures, weakness, numbness and headaches.  Hematological: Negative for adenopathy. Does not bruise/bleed easily.  Psychiatric/Behavioral: Negative for behavioral problems, confusion and sleep disturbance. The patient is not nervous/anxious.     Past Medical History:  Diagnosis Date  . Allergic state 02/11/2015  . Allergy   . H/O hematuria    2003  . H/O prostate cancer 08/28/2011  . Other and unspecified hyperlipidemia 01/18/2014  . Prostate cancer ([place]) 12/24/11   Gleason 3+4=7, volume 15 gm     Social History   Socioeconomic History  . Marital status: Married    Spouse  name: Not on file  . Number of children: Not on file  . Years of education: Not on file  . Highest education level: Not on file  Occupational History  . Not on file  Social Needs  . Financial resource strain: Not on file  . Food insecurity    Worry: Not on file    Inability: Not on file  . Transportation needs    Medical: Not on file    Non-medical: Not on file  Tobacco Use  . Smoking status: Former Smoker    Packs/day: 0.50    Years: 10.00    Pack years: 5.00    Quit date: 08/05/1991    Years since quitting: 27.7  . Smokeless tobacco: Never Used  Substance and Sexual Activity  . Alcohol use: Yes    Alcohol/week: 2.0 standard drinks    Types: 2 Standard drinks or equivalent per week    Comment: 1-2 weekly  . Drug use: No  . 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 Physical activity    Days per week: Not on file    Minutes per session: Not on file  . Stress: Not on file  Relationships  . Social Herbalist on phone: Not on file    Gets together: Not on file    Attends religious service: Not on file    Active member of  club or organization: Not on file    Attends meetings of clubs or organizations: Not on file    Relationship status: Not on file  . Intimate partner violence    Fear of current or ex partner: Not on file    Emotionally abused: Not on file    Physically abused: Not on file    Forced sexual activity: Not on file  Other Topics Concern  . Not on file  Social History Narrative   Married   Former smoker   Quit over 18 years ago-smoked for 10 years 1/2 ppd   3 children    Past Surgical History:  Procedure Laterality Date  . APPENDECTOMY    . CIRCUMCISION  05/12/2012   Procedure: CIRCUMCISION ADULT;  Surgeon: Bernestine Amass, MD;  Location: [facility];  Service: Urology;  Laterality: N/A;  . ROBOT ASSISTED LAPAROSCOPIC RADICAL PROSTATECTOMY  05/12/2012    Procedure: ROBOTIC ASSISTED LAPAROSCOPIC RADICAL PROSTATECTOMY;  Surgeon: Bernestine Amass, MD;  Location: [facility];  Service: Urology;  Laterality: N/A;  . TONSILLECTOMY      Family History  Problem Relation Age of Onset  . Heart disease Brother        MI s/p stent  . Diabetes Father   . Hypertension Father   . Heart disease Father   . Arthritis Father   . Hypertension Mother   . Stroke Mother 70       left arm weakness, speech improving,   . Gout Mother   . Arthritis Sister   . Arthritis Sister   . Cancer Sister   . Hypertension Brother   . Hearing loss Brother   . Hearing loss Son   . Heart disease Brother        MI  . Hypertension Brother   . Colon cancer Neg Hx   . Esophageal cancer Neg Hx   . Rectal cancer Neg Hx   . Stomach cancer Neg Hx     No Known Allergies  Current Outpatient Medications on File Prior to Visit  Medication Sig Dispense Refill  . acetaminophen (TYLENOL) 500 MG tablet Take 1 tablet (500 mg total) by mouth 3 (three) times daily as needed. 90 tablet 5  . BAYER ASPIRIN EC LOW DOSE 81 MG EC tablet TAKE ONE TABLET BY MOUTH ONCE DAILY AS NEEDED FOR PAIN 400 tablet 0  . benzonatate (TESSALON) 100 MG capsule Take 1 capsule (100 mg total) by mouth 3 (three) times daily as needed. 30 capsule 0  . bismuth subsalicylate (PEPTO BISMOL) 262 MG/15ML suspension Take 30 mLs by mouth every 6 (six) hours as needed. 360 mL 3  . budesonide (RHINOCORT AQUA) 32 MCG/ACT nasal spray Place 1 spray into both nostrils daily. 8.6 g 5  . Camphor-Menthol-Methyl Sal 1.2-5.7-6.3 % PTCH Apply 1 patch topically 2 (two) times daily. 40 patch 1  . Cholecalciferol (VITAMIN D PO) Take by mouth.    . cyclobenzaprine (FLEXERIL) 10 MG tablet Take 1 tablet (10 mg total) by mouth at bedtime. 10 tablet 0  . FLOVENT HFA 110 MCG/ACT inhaler INHALE 2 PUFFS TWICE DAILY 36 Inhaler 1  . fluticasone (FLONASE) 50 MCG/ACT nasal spray Place 2 sprays into both nostrils daily. 48 g 3  . ibuprofen  (ADVIL,MOTRIN) 200 MG tablet Take 1 tablet (200 mg total) by mouth 3 (three) times daily as needed. Advil Liqui-gel 240 tablet 0  . Krill Oil 500 MG CAPS Take 1 capsule (500 mg total) by mouth daily. 160 capsule 6  .  Multiple Vitamins-Minerals (AIRBORNE GUMMIES PO)     . Multiple Vitamins-Minerals (GNP MEGA MULTI FOR MEN) TABS [employer] Mega Men Performance & Vitality 30 packs. Take as directed 30 tablet 1  . naproxen sodium (ANAPROX) 220 MG tablet TAKE ONE CAPSULE BY MOUTH TWICE DAILY AS NEEDED 160 tablet 6  . olopatadine (PATANOL) 0.1 % ophthalmic solution INSTILL 1 DROP IN EACH EYE TWICE DAILY AS DIRECTED 15 mL 1  . Saline (SIMPLY SALINE) 0.9 % AERS Place 1 spray into the nose daily. 378 mL 2  . sildenafil (VIAGRA) 100 MG tablet Take 0.5-1 tablets (50-100 mg total) by mouth daily as needed for erectile dysfunction. 9 tablet 3  . traMADol (ULTRAM) 50 MG tablet Take 1 tablet (50 mg total) by mouth every 8 (eight) hours as needed. 15 tablet 0  . triamcinolone (NASACORT ALLERGY 24HR) 55 MCG/ACT AERO nasal inhaler Place 2 sprays into the nose daily. 1 Inhaler 12  . Turmeric Curcumin 500 MG CAPS Take 500 mg by mouth daily. 250 capsule 1  . ZYRTEC-D ALLERGY & CONGESTION 5-120 MG tablet TAKE 1 TABLET BY MOUTH ONCE DAILY AS NEEDED FOR ALLERGIES 24 tablet 0   No current facility-administered medications on file prior to visit.     There were no vitals taken for this visit.      Objective:   Physical Exam  General Mental Status- Alert. General Appearance- Not in acute distress.   Skin General: Color- Normal Color. Moisture- Normal Moisture.  Neck Carotid Arteries- Normal color. Moisture- Normal Moisture. No carotid bruits. No JVD.  Chest and Lung Exam Auscultation: Breath Sounds:-Normal.  Cardiovascular Auscultation:Rythm- Regular. Murmurs & Other Heart Sounds:Auscultation of the heart reveals- No Murmurs.  Abdomen Inspection:-Inspeection Normal. Palpation/Percussion:Note:No mass.  Palpation and Percussion of the abdomen reveal- Non Tender, Non Distended + BS, no rebound or guarding.  Back- no cva tenderness.  heent- canals clear, normal tm. No sinus pressure.  Neurologic Cranial Nerve exam:- CN III-XII intact(No nystagmus), symmetric smile. Strength:- 5/5 equal and symmetric strength both upper and lower extremities.      Assessment & Plan:  For you wellness exam today I have ordered cbc, cmp, lipid panel  and hiv.  Flu vaccine today.  Recommend exercise and healthy diet.  We will let you know lab results as they come in.  Follow up date appointment will be determined after lab review.   Did get xray of  Rt knee. Brief conversation. Decided not to charge. Will inform pt on xray results. Mentioned at end.  Mackie Pai, PA-C

## 2019-05-12 NOTE — Addendum Note (Signed)
Addended by: Hinton Dyer on: 05/12/2019 09:27 AM   Modules accepted: Orders

## 2019-05-12 NOTE — Patient Instructions (Addendum)
For you wellness exam today I have ordered cbc, cmp, lipid panel  and hiv.  Flu vaccine today.  Recommend exercise and healthy diet.  We will let you know lab results as they come in.  Follow up date appointment will be determined after lab review.    Preventive Care 30-58 Years Old, Male Preventive care refers to lifestyle choices and visits with your health care provider that can promote health and wellness. This includes:  A yearly physical exam. This is also called an annual well check.  Regular dental and eye exams.  Immunizations.  Screening for certain conditions.  Healthy lifestyle choices, such as eating a healthy diet, getting regular exercise, not using drugs or products that contain nicotine and tobacco, and limiting alcohol use. What can I expect for my preventive care visit? Physical exam Your health care provider will check:  Height and weight. These may be used to calculate body mass index (BMI), which is a measurement that tells if you are at a healthy weight.  Heart rate and blood pressure.  Your skin for abnormal spots. Counseling Your health care provider may ask you questions about:  Alcohol, tobacco, and drug use.  Emotional well-being.  Home and relationship well-being.  Sexual activity.  Eating habits.  Work and work Statistician. What immunizations do I need?  Influenza (flu) vaccine  This is recommended every year. Tetanus, diphtheria, and pertussis (Tdap) vaccine  You may need a Td booster every 10 years. Varicella (chickenpox) vaccine  You may need this vaccine if you have not already been vaccinated. Zoster (shingles) vaccine  You may need this after age 76. Measles, mumps, and rubella (MMR) vaccine  You may need at least one dose of MMR if you were born in 1957 or later. You may also need a second dose. Pneumococcal conjugate (PCV13) vaccine  You may need this if you have certain conditions and were not previously  vaccinated. Pneumococcal polysaccharide (PPSV23) vaccine  You may need one or two doses if you smoke cigarettes or if you have certain conditions. Meningococcal conjugate (MenACWY) vaccine  You may need this if you have certain conditions. Hepatitis A vaccine  You may need this if you have certain conditions or if you travel or work in places where you may be exposed to hepatitis A. Hepatitis B vaccine  You may need this if you have certain conditions or if you travel or work in places where you may be exposed to hepatitis B. Haemophilus influenzae type b (Hib) vaccine  You may need this if you have certain risk factors. Human papillomavirus (HPV) vaccine  If recommended by your health care provider, you may need three doses over 6 months. You may receive vaccines as individual doses or as more than one vaccine together in one shot (combination vaccines). Talk with your health care provider about the risks and benefits of combination vaccines. What tests do I need? Blood tests  Lipid and cholesterol levels. These may be checked every 5 years, or more frequently if you are over 36 years old.  Hepatitis C test.  Hepatitis B test. Screening  Lung cancer screening. You may have this screening every year starting at age 85 if you have a 30-pack-year history of smoking and currently smoke or have quit within the past 15 years.  Prostate cancer screening. Recommendations will vary depending on your family history and other risks.  Colorectal cancer screening. All adults should have this screening starting at age 48 and continuing until age  57. Your health care provider may recommend screening at age 53 if you are at increased risk. You will have tests every 1-10 years, depending on your results and the type of screening test.  Diabetes screening. This is done by checking your blood sugar (glucose) after you have not eaten for a while (fasting). You may have this done every 1-3  years.  Sexually transmitted disease (STD) testing. Follow these instructions at home: Eating and drinking  Eat a diet that includes fresh fruits and vegetables, whole grains, lean protein, and low-fat dairy products.  Take vitamin and mineral supplements as recommended by your health care provider.  Do not drink alcohol if your health care provider tells you not to drink.  If you drink alcohol: ? Limit how much you have to 0-2 drinks a day. ? Be aware of how much alcohol is in your drink. In the [place] one drink equals one 12 oz bottle of beer (355 mL), one 5 oz glass of wine (148 mL), or one 1 oz glass of hard liquor (44 mL). Lifestyle  Take daily care of your teeth and gums.  Stay active. Exercise for at least 30 minutes on 5 or more days each week.  Do not use any products that contain nicotine or tobacco, such as cigarettes, e-cigarettes, and chewing tobacco. If you need help quitting, ask your health care provider.  If you are sexually active, practice safe sex. Use a condom or other form of protection to prevent STIs (sexually transmitted infections).  Talk with your health care provider about taking a low-dose aspirin every day starting at age 53. What's next?  Go to your health care provider once a year for a well check visit.  Ask your health care provider how often you should have your eyes and teeth checked.  Stay up to date on all vaccines. This information is not intended to replace advice given to you by your health care provider. Make sure you discuss any questions you have with your health care provider. Document Released: 08/17/2015 Document Revised: 07/15/2018 Document Reviewed: 07/15/2018 Elsevier Patient Education  2020 [employer].

## 2019-05-12 NOTE — Addendum Note (Signed)
Addended by: Anabel Halon on: 05/12/2019 09:44 AM   Modules accepted: Orders

## 2019-05-13 LAB — HIV ANTIBODY (ROUTINE TESTING W REFLEX): HIV 1&2 Ab, 4th Generation: NONREACTIVE

## 2019-06-15 ENCOUNTER — Other Ambulatory Visit: Payer: Self-pay | Admitting: Family Medicine

## 2019-08-11 ENCOUNTER — Other Ambulatory Visit: Payer: Self-pay | Admitting: Family Medicine

## 2019-09-08 ENCOUNTER — Telehealth: Payer: Self-pay | Admitting: Family Medicine

## 2019-09-08 DIAGNOSIS — G8929 Other chronic pain: Secondary | ICD-10-CM

## 2019-09-08 NOTE — Telephone Encounter (Signed)
Patient states that he saw Percell Miller last year, patient would like Xray results sent to him and would like to discuss results of lipid panel.

## 2019-09-13 ENCOUNTER — Encounter: Payer: Self-pay | Admitting: *Deleted

## 2019-09-13 NOTE — Telephone Encounter (Signed)
Results given and emailed to patient.  Also referral placed to orthopedic for knee.

## 2019-09-20 ENCOUNTER — Telehealth: Payer: Self-pay | Admitting: Family Medicine

## 2019-09-20 NOTE — Telephone Encounter (Signed)
Would you like to see pt for evaluation or do you want the pt just to call the Ortho office?

## 2019-09-20 NOTE — Telephone Encounter (Signed)
I saw pt in October. I don't see that I saw him recently. If he has seen orthopedist before. He should be able to call them directly and schedule appointment with them.  In October I ordered xray of knee. Hip pain may be associated with knee pain.

## 2019-09-20 NOTE — Telephone Encounter (Signed)
Patient states that he seen Percell Miller, recently and he referred patient over to [facility] care [facility].  Patient is experiencing right hip pain and would like to know if Percell Miller could have othro care to xray hip in morning

## 2019-09-21 ENCOUNTER — Ambulatory Visit (INDEPENDENT_AMBULATORY_CARE_PROVIDER_SITE_OTHER): Payer: BC Managed Care – PPO | Admitting: Orthopedic Surgery

## 2019-09-21 ENCOUNTER — Ambulatory Visit: Payer: BC Managed Care – PPO | Admitting: Orthopedic Surgery

## 2019-09-21 ENCOUNTER — Other Ambulatory Visit: Payer: Self-pay

## 2019-09-21 DIAGNOSIS — M1711 Unilateral primary osteoarthritis, right knee: Secondary | ICD-10-CM

## 2019-09-21 MED ORDER — MELOXICAM 15 MG PO TABS: ORAL_TABLET | ORAL | 1 refills | Status: DC

## 2019-09-21 NOTE — Telephone Encounter (Signed)
Spoke with patient , and he said he will call an orthopedic office

## 2019-09-22 ENCOUNTER — Encounter: Payer: Self-pay | Admitting: Orthopedic Surgery

## 2019-09-22 NOTE — Progress Notes (Signed)
Office Visit Note   Patient: John Lane           Date of Birth: Sep 01, 1960           MRN: [id number] Visit Date: 09/21/2019 Requested by: John Pai, PA-C [address],  [place] [address] [id number] PCP: Mosie Lukes, MD  Subjective: Chief Complaint  Patient presents with  . 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  Denies any history of injury.  Reports some popping but no locking.  No prior surgery in the knee.  Does describe decreased walking endurance as well as stiffness after he has been sitting for a while.  He is on supplements.  Outside radiographs shows some spurring of the patella as well as mild medial joint space narrowing.              ROS: All systems reviewed are negative as they relate to the chief complaint within the history of present illness.  Patient denies  fevers or chills.   Assessment & Plan: Visit Diagnoses:  1. Arthritis of right knee     Plan: Impression is right knee pain with mild medial compartment arthritis and trace effusion.  He states that the pain is about a level 5 out of 10.  Not too keen on injections today.  We will try him on some Mobic once a day for 30 days then daily as needed.  I would favor injection as next step in management of this problem.  Further imaging and arthroscopic intervention could be considered if he fails that next conservative treatment method.  Come back in 6 weeks if he is no better after the anti-inflammatory.  Follow-Up Instructions: Return if symptoms worsen or fail to improve.   Orders:  No orders of the defined types were placed in this encounter.  Meds ordered this encounter  Medications  . meloxicam (MOBIC) 15 MG tablet    Sig: 1 po q d prn    Dispense:  30 tablet    Refill:  1      Procedures: No procedures performed   Clinical Data: No additional findings.  Objective:  Vital Signs: There were no vitals taken for this visit.  Physical Exam:   Constitutional: Patient appears well-developed HEENT:  Head: Normocephalic Eyes:EOM are normal Neck: Normal range of motion Cardiovascular: Normal rate Pulmonary/chest: Effort normal Neurologic: Patient is alert Skin: Skin is warm Psychiatric: Patient has normal mood and affect    Ortho Exam: Ortho exam demonstrates trace right knee effusion no left knee effusion.  Range of motion is full.  Not much in the way of focal joint line tenderness is present.  No masses lymphadenopathy or skin changes noted in that right knee region.  No groin pain with internal X rotation leg.  Pedal pulses palpable.  Negative McMurray compression testing.  Does have slight varus alignment right more than left.  Specialty Comments:  No specialty comments available.  Imaging: No results found.   PMFS History: Patient Active Problem List   Diagnosis Date Noted  . H/O adenomatous polyp of colon 05/10/2018  . Hip pain, bilateral 04/13/2017  . Pain in joint, shoulder region 02/11/2015  . Allergic state 02/11/2015  . Knee pain, bilateral 01/18/2014  . Hyperlipidemia 01/18/2014  . Ear pain 06/02/2013  . Prostate cancer ([place]) 12/24/2011  . Preventative health care 08/28/2011  .  H/O prostate cancer 08/28/2011  . 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 BAKER'S CYST, RIGHT KNEE 04/14/2008  . TINEA BARBAE 10/07/2007   Past Medical History:  Diagnosis Date  . Allergic state 02/11/2015  . Allergy   . H/O hematuria    2003  . H/O prostate cancer 08/28/2011  . Other and unspecified hyperlipidemia 01/18/2014  . 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 Heart disease Brother        MI s/p stent  . Diabetes Father   . Hypertension Father   . Heart disease Father   . Arthritis Father   . Hypertension Mother   . Stroke Mother 78       left arm weakness,  speech improving,   . Gout Mother   . Arthritis Sister   . Arthritis Sister   . Cancer Sister   . Hypertension Brother   . Hearing loss Brother   . Hearing loss Son   . Heart disease Brother        MI  . Hypertension Brother   . Colon cancer Neg Hx   . Esophageal cancer Neg Hx   . Rectal cancer Neg Hx   . Stomach cancer Neg Hx     Past Surgical History:  Procedure Laterality Date  . APPENDECTOMY    . CIRCUMCISION  05/12/2012   Procedure: CIRCUMCISION ADULT;  Surgeon: Bernestine Amass, MD;  Location: [facility];  Service: Urology;  Laterality: N/A;  . ROBOT ASSISTED LAPAROSCOPIC RADICAL PROSTATECTOMY  05/12/2012   Procedure: ROBOTIC ASSISTED LAPAROSCOPIC RADICAL PROSTATECTOMY;  Surgeon: Bernestine Amass, MD;  Location: [facility];  Service: Urology;  Laterality: N/A;  . TONSILLECTOMY     Social History   Occupational History  . Not on file  Tobacco Use  . Smoking status: Former Smoker    Packs/day: 0.50    Years: 10.00    Pack years: 5.00    Quit date: 08/05/1991    Years since quitting: 28.1  . Smokeless tobacco: Never Used  Substance and Sexual Activity  . Alcohol use: Yes    Alcohol/week: 2.0 standard drinks    Types: 2 Standard drinks or equivalent per week    Comment: 1-2 weekly  . Drug use: No  . 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

## 2019-10-03 ENCOUNTER — Other Ambulatory Visit: Payer: Self-pay | Admitting: Family Medicine

## 2019-11-03 ENCOUNTER — Ambulatory Visit: Payer: BC Managed Care – PPO | Attending: Internal Medicine

## 2019-11-03 DIAGNOSIS — Z23 Encounter for immunization: Secondary | ICD-10-CM

## 2019-11-03 NOTE — Progress Notes (Signed)
   Covid-19 Vaccination Clinic  Name:  John Lane    MRN: [id number] DOB: 1961/03/12  11/03/2019  [name] was observed post Covid-19 immunization for 15 minutes without incident. He was provided with Vaccine Information Sheet and instruction to access the V-Safe system.   [name] was instructed to call 911 with any severe reactions post vaccine: [name] Kitchen Difficulty breathing  . Swelling of face and throat  . A fast heartbeat  . A bad rash all over body  . Dizziness and weakness   Immunizations Administered    Name Date Dose VIS Date Route   Pfizer COVID-19 Vaccine 11/03/2019  4:13 PM 0.3 mL 07/15/2019 Intramuscular   Manufacturer: [employer], [employer]   Lot: [id number]   [place]: [id number]

## 2019-11-23 ENCOUNTER — Other Ambulatory Visit: Payer: Self-pay | Admitting: Family Medicine

## 2019-11-30 ENCOUNTER — Ambulatory Visit: Payer: BC Managed Care – PPO | Attending: Internal Medicine

## 2019-11-30 DIAGNOSIS — Z23 Encounter for immunization: Secondary | ICD-10-CM

## 2019-11-30 NOTE — Progress Notes (Signed)
   Covid-19 Vaccination Clinic  Name:  DARTANION PYTEL    MRN: [id number] DOB: June 04, 1961  11/30/2019  Mr. Huinker was observed post Covid-19 immunization for 15 minutes without incident. He was provided with Vaccine Information Sheet and instruction to access the V-Safe system.   Mr. Porter was instructed to call 911 with any severe reactions post vaccine: [name] Kitchen Difficulty breathing  . Swelling of face and throat  . A fast heartbeat  . A bad rash all over body  . Dizziness and weakness   Immunizations Administered    Name Date Dose VIS Date Route   Pfizer COVID-19 Vaccine 11/30/2019  4:59 PM 0.3 mL 09/28/2018 Intramuscular   Manufacturer: [place]   Lot: [id number]   [place]: [id number]

## 2020-01-16 ENCOUNTER — Other Ambulatory Visit: Payer: Self-pay | Admitting: Family Medicine

## 2020-03-23 ENCOUNTER — Other Ambulatory Visit: Payer: Self-pay

## 2020-03-23 ENCOUNTER — Ambulatory Visit (INDEPENDENT_AMBULATORY_CARE_PROVIDER_SITE_OTHER): Payer: BC Managed Care – PPO | Admitting: Medical

## 2020-03-23 ENCOUNTER — Ambulatory Visit (HOSPITAL_BASED_OUTPATIENT_CLINIC_OR_DEPARTMENT_OTHER)
Admission: RE | Admit: 2020-03-23 | Discharge: 2020-03-23 | Disposition: A | Discharge status: Home / Self Care | Payer: BC Managed Care – PPO | Source: Ambulatory Visit | Attending: Medical | Admitting: Medical

## 2020-03-23 ENCOUNTER — Other Ambulatory Visit: Payer: Self-pay | Admitting: Medical

## 2020-03-23 ENCOUNTER — Encounter: Payer: Self-pay | Admitting: Medical

## 2020-03-23 VITALS — BP 138/80 | HR 73 | Resp 18 | Ht 65.0 in | Wt 144.0 lb

## 2020-03-23 DIAGNOSIS — M5441 Lumbago with sciatica, right side: Secondary | ICD-10-CM | POA: Insufficient documentation

## 2020-03-23 DIAGNOSIS — M5442 Lumbago with sciatica, left side: Secondary | ICD-10-CM | POA: Insufficient documentation

## 2020-03-23 DIAGNOSIS — M25551 Pain in right hip: Secondary | ICD-10-CM | POA: Diagnosis not present

## 2020-03-23 DIAGNOSIS — M545 Low back pain: Secondary | ICD-10-CM | POA: Diagnosis not present

## 2020-03-23 DIAGNOSIS — G8929 Other chronic pain: Secondary | ICD-10-CM

## 2020-03-23 DIAGNOSIS — M25552 Pain in left hip: Secondary | ICD-10-CM

## 2020-03-23 MED ORDER — KETOROLAC TROMETHAMINE 60 MG/2ML IM SOLN
60.0000 mg | Freq: Once | INTRAMUSCULAR | Status: AC
  Administered 2020-03-23: 60 mg via INTRAMUSCULAR

## 2020-03-23 MED ORDER — DICLOFENAC SODIUM 75 MG PO TBEC: 75.0000 mg | DELAYED_RELEASE_TABLET | Freq: Two times a day (BID) | ORAL | 0 refills | Status: DC

## 2020-03-23 NOTE — Progress Notes (Signed)
Subjective:    Patient ID: John Lane, male    DOB: 19-Apr-1961, 59 y.o.   MRN: [id number]  HPI   Pt in for some rt hip, buttox and leg area pain.Pt walks a lot at work and drives a lot at  work. 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  When he gets out of car area feels stiff.  Takes advil and tylenol.   Pain present for about a year on and off. He thinks pain is getting worse.  Pt has no prostate. Was removed.  Up to date on colonoscpy.    Review of Systems  Constitutional: Negative for chills, fatigue and fever.  Respiratory: Negative for cough, chest tightness, shortness of breath and wheezing.   Cardiovascular: Negative for chest pain and palpitations.  Gastrointestinal: Negative for abdominal pain.       No loss of appetite. Eating about the same.  Genitourinary: Negative for dysuria, flank pain and frequency.  Musculoskeletal: Positive for back pain.       Hip pain  Skin: Negative for rash.  Neurological: Negative for dizziness, syncope, facial asymmetry, speech difficulty, weakness and headaches.  Hematological: Negative for adenopathy. Does not bruise/bleed easily.  Psychiatric/Behavioral: Negative for behavioral problems, confusion and sleep disturbance. The patient is not nervous/anxious.    Past Medical History:  Diagnosis Date  . Allergic state 02/11/2015  . Allergy   . H/O hematuria    2003  . H/O prostate cancer 08/28/2011  . Other and unspecified hyperlipidemia 01/18/2014  . 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 Marital status: Married    Spouse name: Not on file  . Number of children: Not on file  . Years of education: Not on file  . Highest education level: Not on file  Occupational History  . Not on file  Tobacco Use  . Smoking status: Former Smoker    Packs/day: 0.50    Years: 10.00    Pack years: 5.00    Quit date: 08/05/1991    Years since  quitting: 28.6  . Smokeless tobacco: Never Used  Substance and Sexual Activity  . Alcohol use: Yes    Alcohol/week: 2.0 standard drinks    Types: 2 Standard drinks or equivalent per week    Comment: 1-2 weekly  . Drug use: No  . 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 Not on file  Social History Narrative   Married   Former smoker   Quit over 18 years ago-smoked for 10 years 1/2 ppd   3 children   Social Determinants of Radio broadcast assistant Strain:   . Difficulty of Paying Living Expenses: Not on file  Food Insecurity:   . Worried About Charity fundraiser in the Last Year: Not on file  . Ran Out of Food in the Last Year: Not on file  Transportation Needs:   . Lack of Transportation (Medical): Not on file  . Lack of Transportation (Non-Medical): Not on file  Physical Activity:   . Days of Exercise per Week: Not on file  . Minutes of Exercise per Session: Not on file  Stress:   . Feeling of Stress : Not on file  Social Connections:   . Frequency of Communication with Friends and Family:  Not on file  . Frequency of Social Gatherings with Friends and Family: Not on file  . Attends Religious Services: Not on file  . Active Member of Clubs or Organizations: Not on file  . Attends Archivist Meetings: Not on file  . Marital Status: Not on file  Intimate Partner Violence:   . Fear of Current or Ex-Partner: Not on file  . Emotionally Abused: Not on file  . Physically Abused: Not on file  . Sexually Abused: Not on file    Past Surgical History:  Procedure Laterality Date  . APPENDECTOMY    . CIRCUMCISION  05/12/2012   Procedure: CIRCUMCISION ADULT;  Surgeon: Bernestine Amass, MD;  Location: [facility];  Service: Urology;  Laterality: N/A;  . ROBOT ASSISTED LAPAROSCOPIC RADICAL PROSTATECTOMY  05/12/2012   Procedure: ROBOTIC ASSISTED LAPAROSCOPIC RADICAL  PROSTATECTOMY;  Surgeon: Bernestine Amass, MD;  Location: [facility];  Service: Urology;  Laterality: N/A;  . TONSILLECTOMY      Family History  Problem Relation Age of Onset  . Heart disease Brother        MI s/p stent  . Diabetes Father   . Hypertension Father   . Heart disease Father   . Arthritis Father   . Hypertension Mother   . Stroke Mother 35       left arm weakness, speech improving,   . Gout Mother   . Arthritis Sister   . Arthritis Sister   . Cancer Sister   . Hypertension Brother   . Hearing loss Brother   . Hearing loss Son   . Heart disease Brother        MI  . Hypertension Brother   . Colon cancer Neg Hx   . Esophageal cancer Neg Hx   . Rectal cancer Neg Hx   . Stomach cancer Neg Hx     No Known Allergies  Current Outpatient Medications on File Prior to Visit  Medication Sig Dispense Refill  . acetaminophen (TYLENOL) 500 MG tablet Take 1 tablet (500 mg total) by mouth 3 (three) times daily as needed. 90 tablet 5  . BAYER ASPIRIN EC LOW DOSE 81 MG EC tablet TAKE ONE TABLET BY MOUTH ONCE DAILY AS NEEDED FOR PAIN 400 tablet 0  . benzonatate (TESSALON) 100 MG capsule Take 1 capsule (100 mg total) by mouth 3 (three) times daily as needed. 30 capsule 0  . bismuth subsalicylate (PEPTO BISMOL) 262 MG/15ML suspension Take 30 mLs by mouth every 6 (six) hours as needed. 360 mL 3  . budesonide (RHINOCORT AQUA) 32 MCG/ACT nasal spray Place 1 spray into both nostrils daily. 8.6 g 5  . Camphor-Menthol-Methyl Sal 1.2-5.7-6.3 % PTCH Apply 1 patch topically 2 (two) times daily. 40 patch 1  . Cholecalciferol (VITAMIN D PO) Take by mouth.    . cyclobenzaprine (FLEXERIL) 10 MG tablet Take 1 tablet (10 mg total) by mouth at bedtime. 10 tablet 0  . FLOVENT HFA 110 MCG/ACT inhaler INHALE 2 PUFFS TWICE DAILY 36 Inhaler 1  . fluticasone (FLONASE) 50 MCG/ACT nasal spray Place 2 sprays into both nostrils daily. 48 g 3  . ibuprofen (ADVIL,MOTRIN) 200 MG tablet Take 1 tablet (200 mg total)  by mouth 3 (three) times daily as needed. Advil Liqui-gel 240 tablet 0  . Krill Oil 500 MG CAPS Take 1 capsule (500 mg total) by mouth daily. 160 capsule 6  . meloxicam (MOBIC) 15 MG tablet 1 po q d prn 30 tablet 1  .  Multiple Vitamins-Minerals (AIRBORNE GUMMIES PO)     . Multiple Vitamins-Minerals (GNP MEGA MULTI FOR MEN) TABS [employer] Mega Men Performance & Vitality 30 packs. Take as directed 30 tablet 1  . naproxen sodium (ANAPROX) 220 MG tablet TAKE ONE CAPSULE BY MOUTH TWICE DAILY AS NEEDED 160 tablet 6  . olopatadine (PATANOL) 0.1 % ophthalmic solution INSTILL 1 DROP IN EACH EYE TWICE DAILY AS DIRECTED 15 mL 1  . Saline (SIMPLY SALINE) 0.9 % AERS Place 1 spray into the nose daily. 378 mL 2  . sildenafil (VIAGRA) 100 MG tablet Take 0.5-1 tablets (50-100 mg total) by mouth daily as needed for erectile dysfunction. 9 tablet 3  . traMADol (ULTRAM) 50 MG tablet Take 1 tablet (50 mg total) by mouth every 8 (eight) hours as needed. 15 tablet 0  . triamcinolone (NASACORT ALLERGY 24HR) 55 MCG/ACT AERO nasal inhaler Place 2 sprays into the nose daily. 1 Inhaler 12  . Turmeric Curcumin 500 MG CAPS Take 500 mg by mouth daily. 250 capsule 1  . ZYRTEC-D ALLERGY & CONGESTION 5-120 MG tablet TAKE 1 TABLET BY MOUTH ONCE DAILY AS NEEDED FOR ALLERGIES 24 tablet 0   No current facility-administered medications on file prior to visit.    BP (!) 144/103 (BP Location: Right Arm, Patient Position: Sitting, Cuff Size: Large)   Pulse 73   Resp 18   Ht 5\' 5"  (1.651 m)   Wt 144 lb (65.3 kg)   SpO2 93%   BMI 23.96 kg/m      Objective:   Physical Exam  General- No acute distress. Pleasant patient. Neck- Full range of motion, no jvd Lungs- Clear, even and unlabored. Heart- regular rate and rhythm. Neurologic- CNII- XII grossly intact. Lower back- mid lower back mild tender and bilateral mild si tender. Hips- left side mild pain on rom. Rt hip- moderate pain on palpation and rom.      Assessment & Plan:    For moderate pain we gave you toradol 30 mg im  Tomorrow can start diclofenac nsaid.  Back stretching exercise as tolerated.  Xray of both back and hips.  May refer to sport medicine if pain persists since so long duration.  Also discussed your 5 lb weight loss. Need to follow you closely and make sure not dropping further   Can schedule physical in October or sooner.

## 2020-03-23 NOTE — Patient Instructions (Addendum)
For moderate pain we gave you toradol 30 mg im  Tomorrow can start diclofenac nsaid.  Back stretching exercise as tolerated.  Xray of both back and hips.  May refer to sport medicine if pain persists since so long duration.  Also discussed your 5 lb weight loss. Need to follow you closely and make sure not dropping further.  Can schedule physical in October or sooner.

## 2020-04-03 IMAGING — DX DG KNEE 3 VIEWS*R*
3 series · 3 of 3 positions shown · non-contrast
Comparison: CT scan from 3889.

CLINICAL DATA: Ten year history of knee pain.

EXAM:
RIGHT KNEE - 3 VIEW

[knee ap]
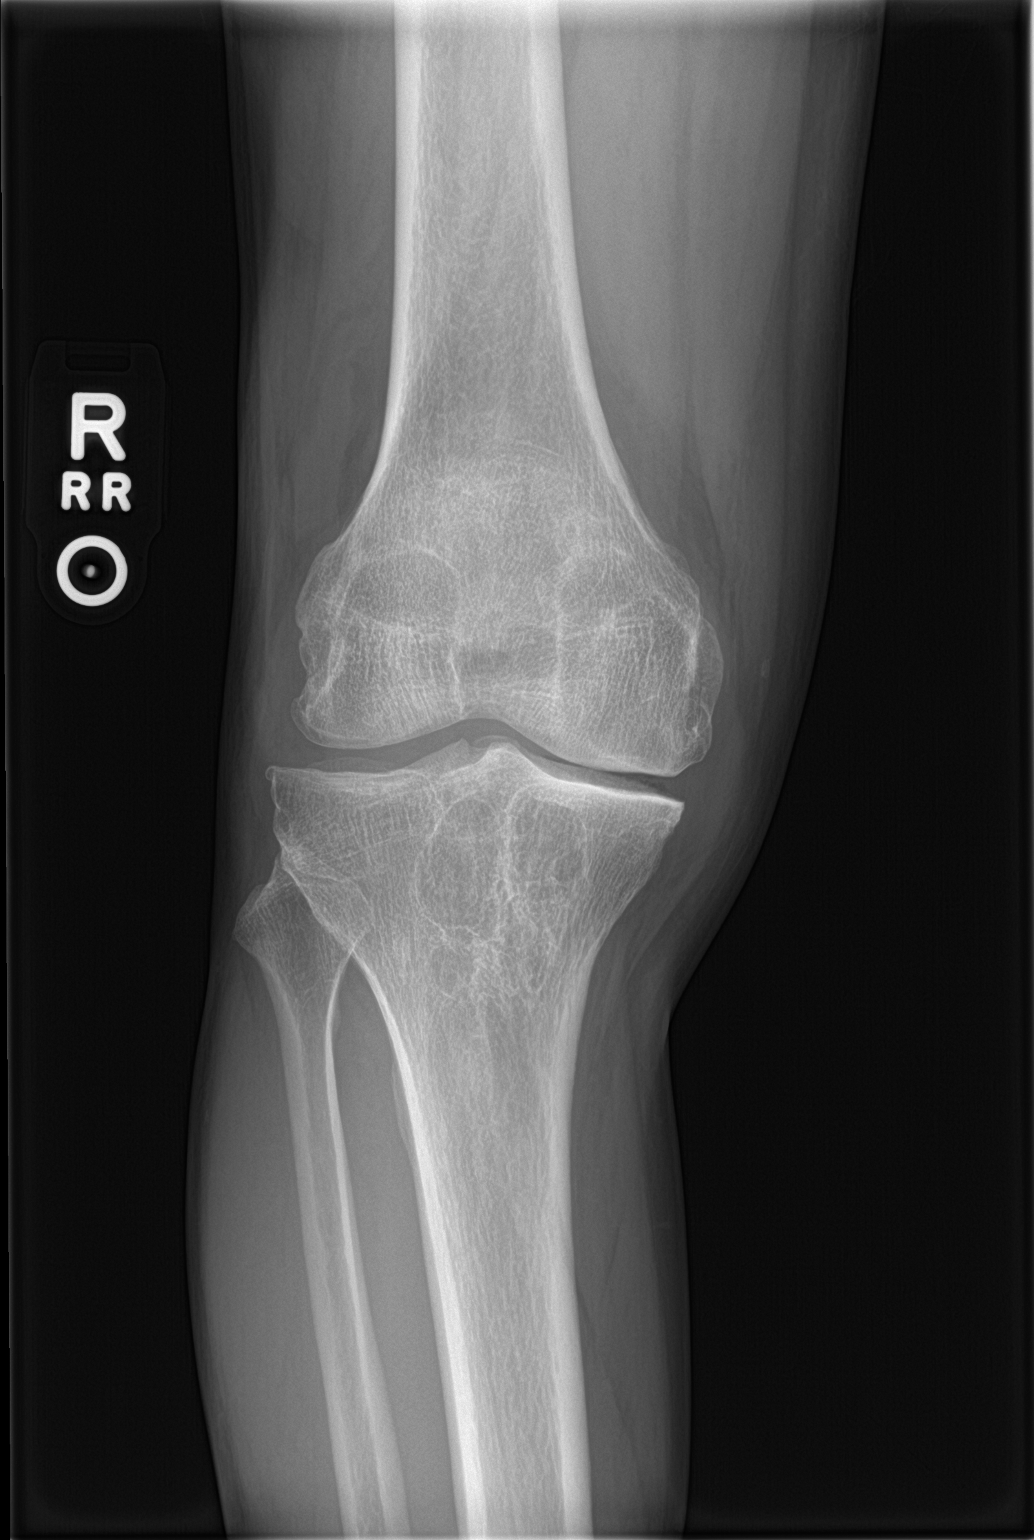

[knee lat]
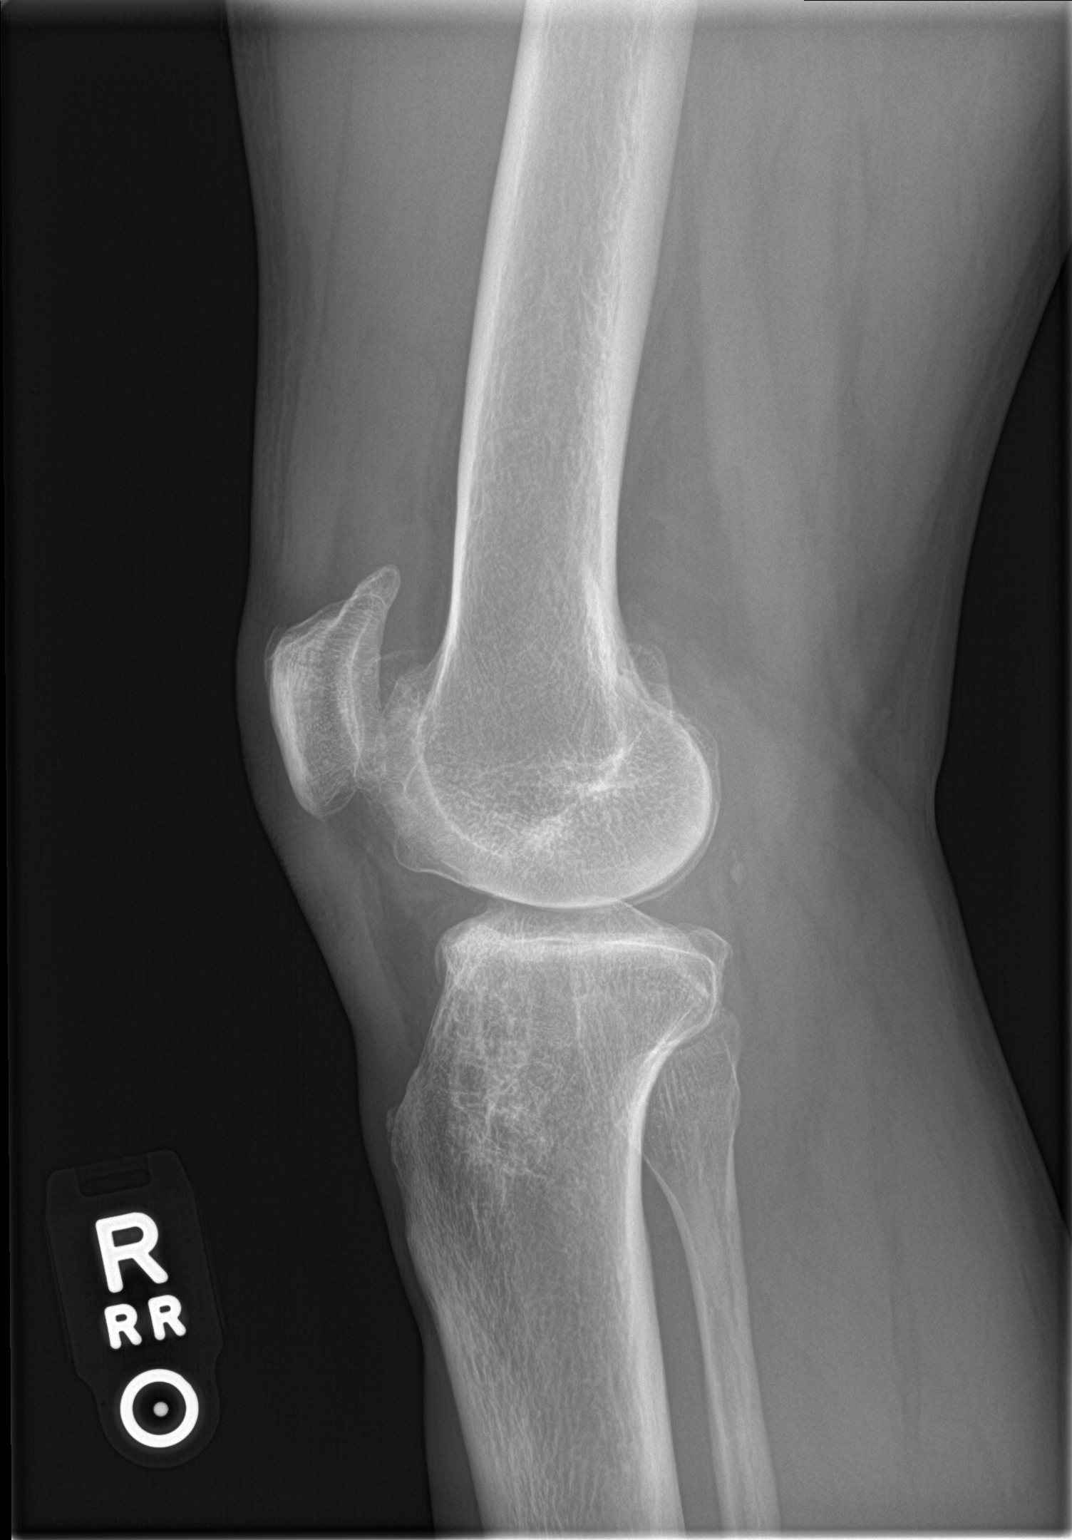

[knee sunrise]
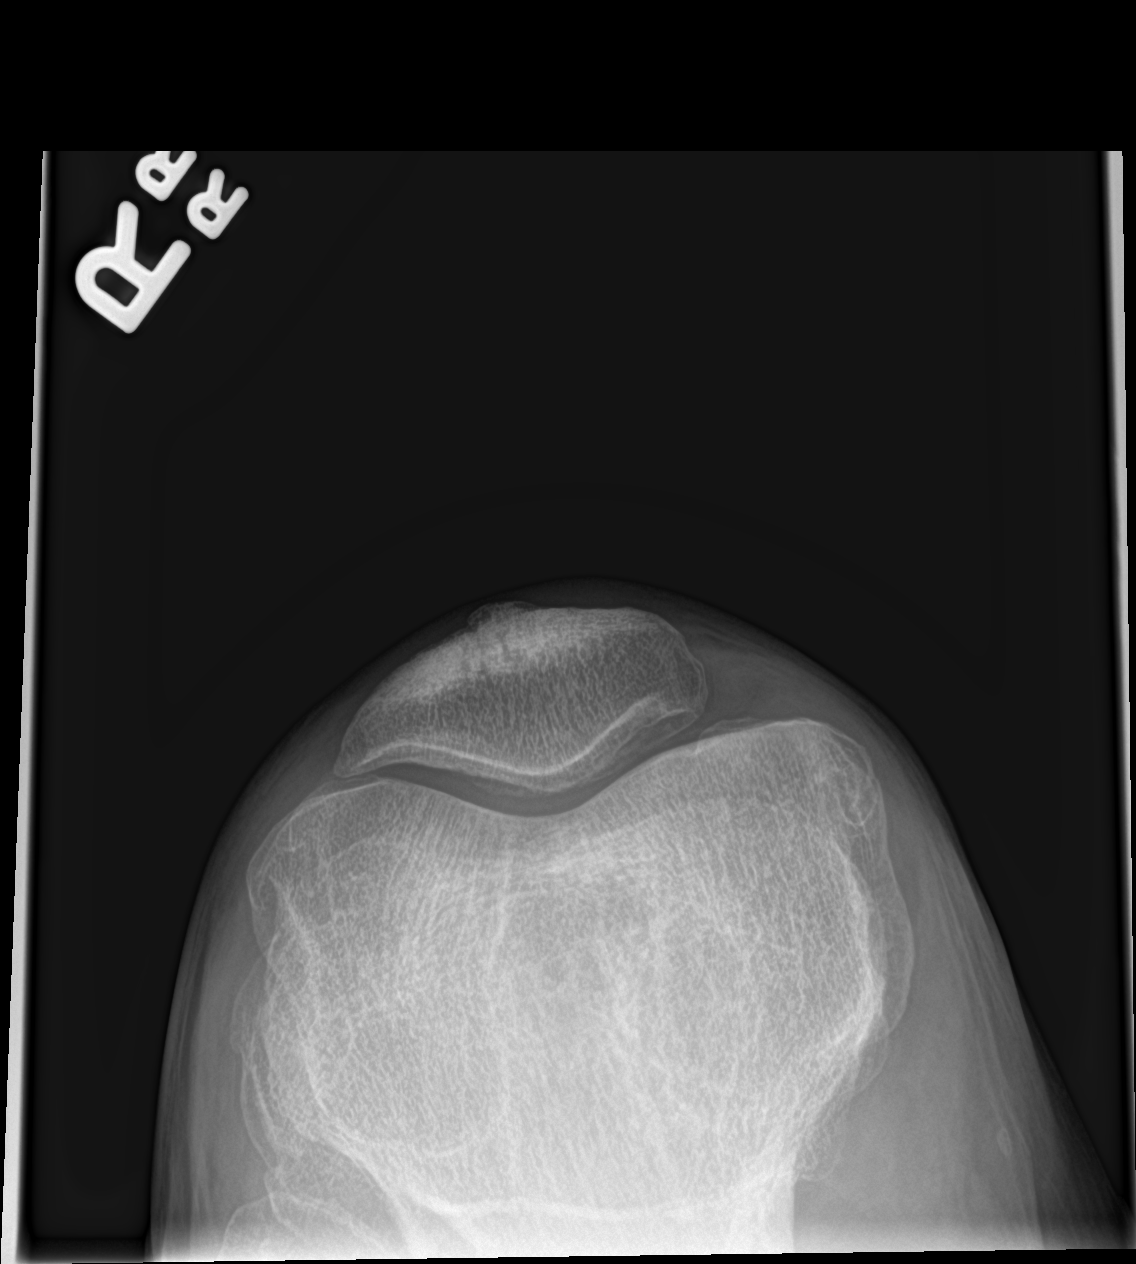

[3 of 3 positions shown; findings below may reference images not displayed]

FINDINGS: Moderate to advanced tricompartmental degenerative changes with
joint space narrowing, osteophytic spurring and subchondral cystic
change. There is a stable multi septated low-attenuation lesion in
the proximal tibia. On the prior CT scan this contain fat and is
most likely an intraosseous lipoma. No new or progressive findings.

There is a moderate-sized joint effusion noted.
IMPRESSION: Moderate to advanced tricompartmental degenerative changes but no
acute bony findings.

Stable multiseptated fatty lesion in the proximal tibia, consistent
with intraosseous lipoma.

Small to moderate-sized joint effusion suspected.

## 2020-05-02 ENCOUNTER — Encounter: Payer: BC Managed Care – PPO | Admitting: Medical

## 2020-05-14 ENCOUNTER — Other Ambulatory Visit: Payer: Self-pay | Admitting: Medical

## 2020-05-18 ENCOUNTER — Encounter: Payer: BC Managed Care – PPO | Admitting: Medical

## 2020-06-01 ENCOUNTER — Encounter: Payer: Self-pay | Admitting: Medical

## 2020-06-01 ENCOUNTER — Other Ambulatory Visit: Payer: Self-pay

## 2020-06-01 ENCOUNTER — Ambulatory Visit (INDEPENDENT_AMBULATORY_CARE_PROVIDER_SITE_OTHER): Payer: BC Managed Care – PPO | Admitting: Medical

## 2020-06-01 VITALS — BP 130/83 | HR 73 | Temp 98.2°F | Ht 65.0 in | Wt 146.2 lb

## 2020-06-01 DIAGNOSIS — R6882 Decreased libido: Secondary | ICD-10-CM | POA: Diagnosis not present

## 2020-06-01 DIAGNOSIS — Z23 Encounter for immunization: Secondary | ICD-10-CM | POA: Diagnosis not present

## 2020-06-01 DIAGNOSIS — R5383 Other fatigue: Secondary | ICD-10-CM

## 2020-06-01 DIAGNOSIS — Z0000 Encounter for general adult medical examination without abnormal findings: Secondary | ICD-10-CM | POA: Diagnosis not present

## 2020-06-01 DIAGNOSIS — Z8546 Personal history of malignant neoplasm of prostate: Secondary | ICD-10-CM

## 2020-06-01 DIAGNOSIS — E785 Hyperlipidemia, unspecified: Secondary | ICD-10-CM | POA: Diagnosis not present

## 2020-06-01 NOTE — Progress Notes (Signed)
Subjective:    Patient ID: John Lane, male    DOB: 22-Feb-1961, 59 y.o.   MRN: [id number]  HPI  Pt in for cpe/wellness exam.  Pt is fasting.   Pt has some low libido since prostate surgery 8 years ago.  Pt states he has been exercising little bit. Doing some push ups and abdomen exercise. Pt states he walks a lot at work. He does not know how many steps he gets on average. Pt states moderate healthy diet. Avoid fried foods. Rare/random occasional beer. 2 cups of coffee a day. Non smoker  Pt will get flu vaccine today.   Pt had hx of prostate cancer. Regularly followed by urologist. 2 months ago follow up with urologist he states psa was 0.  Up to date on colonoscopy.     Review of Systems  Constitutional: Negative for chills, fatigue and fever.  HENT: Negative for congestion.   Respiratory: Negative for cough, chest tightness and shortness of breath.   Cardiovascular: Negative for chest pain and palpitations.  Gastrointestinal: Negative for abdominal pain.  Genitourinary: Negative for dysuria and flank pain.  Musculoskeletal: Negative for back pain and joint swelling.  Skin: Negative for rash.  Neurological: Negative for dizziness, syncope, weakness, numbness and headaches.  Hematological: Negative for adenopathy. Does not bruise/bleed easily.  Psychiatric/Behavioral: Negative for behavioral problems, decreased concentration and hallucinations.    Past Medical History:  Diagnosis Date  . Allergic state 02/11/2015  . Allergy   . H/O hematuria    2003  . H/O prostate cancer 08/28/2011  . Other and unspecified hyperlipidemia 01/18/2014  . Prostate cancer ([place]) 12/24/11   Gleason 3+4=7, volume 15 gm     Social History   Socioeconomic History  . Marital status: Married    Spouse name: Not on file  . Number of children: Not on file  . Years of education: Not on file  . Highest education level: Not on file  Occupational History  . Not on file  Tobacco Use  .  Smoking status: Former Smoker    Packs/day: 0.50    Years: 10.00    Pack years: 5.00    Quit date: 08/05/1991    Years since quitting: 28.8  . Smokeless tobacco: Never Used  Substance and Sexual Activity  . Alcohol use: Yes    Alcohol/week: 2.0 standard drinks    Types: 2 Standard drinks or equivalent per week    Comment: 1-2 weekly  . Drug use: No  . 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 Not on file  Social History Narrative   Married   Former smoker   Quit over 18 years ago-smoked for 10 years 1/2 ppd   3 children   Social Determinants of Radio broadcast assistant Strain:   . Difficulty of Paying Living Expenses: Not on file  Food Insecurity:   . Worried About Charity fundraiser in the Last Year: Not on file  . Ran Out of Food in the Last Year: Not on file  Transportation Needs:   . Lack of Transportation (Medical): Not on file  . Lack of Transportation (Non-Medical): Not on file  Physical Activity:   . Days of Exercise per Week: Not on file  . Minutes of Exercise per Session: Not on file  Stress:   . Feeling of Stress : Not on file  Social Connections:   .  Frequency of Communication with Friends and Family: Not on file  . Frequency of Social Gatherings with Friends and Family: Not on file  . Attends Religious Services: Not on file  . Active Member of Clubs or Organizations: Not on file  . Attends Archivist Meetings: Not on file  . Marital Status: Not on file  Intimate Partner Violence:   . Fear of Current or Ex-Partner: Not on file  . Emotionally Abused: Not on file  . Physically Abused: Not on file  . Sexually Abused: Not on file    Past Surgical History:  Procedure Laterality Date  . APPENDECTOMY    . CIRCUMCISION  05/12/2012   Procedure: CIRCUMCISION ADULT;  Surgeon: Bernestine Amass, MD;  Location: [facility];  Service: Urology;  Laterality:  N/A;  . ROBOT ASSISTED LAPAROSCOPIC RADICAL PROSTATECTOMY  05/12/2012   Procedure: ROBOTIC ASSISTED LAPAROSCOPIC RADICAL PROSTATECTOMY;  Surgeon: Bernestine Amass, MD;  Location: [facility];  Service: Urology;  Laterality: N/A;  . TONSILLECTOMY      Family History  Problem Relation Age of Onset  . Heart disease Brother        MI s/p stent  . Diabetes Father   . Hypertension Father   . Heart disease Father   . Arthritis Father   . Hypertension Mother   . Stroke Mother 84       left arm weakness, speech improving,   . Gout Mother   . Arthritis Sister   . Arthritis Sister   . Cancer Sister   . Hypertension Brother   . Hearing loss Brother   . Hearing loss Son   . Heart disease Brother        MI  . Hypertension Brother   . Colon cancer Neg Hx   . Esophageal cancer Neg Hx   . Rectal cancer Neg Hx   . Stomach cancer Neg Hx     No Known Allergies  Current Outpatient Medications on File Prior to Visit  Medication Sig Dispense Refill  . acetaminophen (TYLENOL) 500 MG tablet Take 1 tablet (500 mg total) by mouth 3 (three) times daily as needed. 90 tablet 5  . BAYER ASPIRIN EC LOW DOSE 81 MG EC tablet TAKE ONE TABLET BY MOUTH ONCE DAILY AS NEEDED FOR PAIN 400 tablet 0  . benzonatate (TESSALON) 100 MG capsule Take 1 capsule (100 mg total) by mouth 3 (three) times daily as needed. 30 capsule 0  . bismuth subsalicylate (PEPTO BISMOL) 262 MG/15ML suspension Take 30 mLs by mouth every 6 (six) hours as needed. 360 mL 3  . budesonide (RHINOCORT AQUA) 32 MCG/ACT nasal spray Place 1 spray into both nostrils daily. 8.6 g 5  . Camphor-Menthol-Methyl Sal 1.2-5.7-6.3 % PTCH Apply 1 patch topically 2 (two) times daily. 40 patch 1  . Cholecalciferol (VITAMIN D PO) Take by mouth.    . cyclobenzaprine (FLEXERIL) 10 MG tablet Take 1 tablet (10 mg total) by mouth at bedtime. 10 tablet 0  . diclofenac (VOLTAREN) 75 MG EC tablet Take 1 tablet by mouth twice daily 20 tablet 0  . FLOVENT HFA 110 MCG/ACT inhaler  INHALE 2 PUFFS TWICE DAILY 36 Inhaler 1  . fluticasone (FLONASE) 50 MCG/ACT nasal spray Place 2 sprays into both nostrils daily. 48 g 3  . ibuprofen (ADVIL,MOTRIN) 200 MG tablet Take 1 tablet (200 mg total) by mouth 3 (three) times daily as needed. Advil Liqui-gel 240 tablet 0  . Krill Oil 500 MG CAPS Take 1 capsule (500  mg total) by mouth daily. 160 capsule 6  . meloxicam (MOBIC) 15 MG tablet 1 po q d prn 30 tablet 1  . Multiple Vitamins-Minerals (AIRBORNE GUMMIES PO)     . Multiple Vitamins-Minerals (GNP MEGA MULTI FOR MEN) TABS [employer] Mega Men Performance & Vitality 30 packs. Take as directed 30 tablet 1  . naproxen sodium (ANAPROX) 220 MG tablet TAKE ONE CAPSULE BY MOUTH TWICE DAILY AS NEEDED 160 tablet 6  . olopatadine (PATANOL) 0.1 % ophthalmic solution INSTILL 1 DROP IN EACH EYE TWICE DAILY AS DIRECTED 15 mL 1  . Saline (SIMPLY SALINE) 0.9 % AERS Place 1 spray into the nose daily. 378 mL 2  . sildenafil (VIAGRA) 100 MG tablet Take 0.5-1 tablets (50-100 mg total) by mouth daily as needed for erectile dysfunction. 9 tablet 3  . traMADol (ULTRAM) 50 MG tablet Take 1 tablet (50 mg total) by mouth every 8 (eight) hours as needed. 15 tablet 0  . triamcinolone (NASACORT ALLERGY 24HR) 55 MCG/ACT AERO nasal inhaler Place 2 sprays into the nose daily. 1 Inhaler 12  . Turmeric Curcumin 500 MG CAPS Take 500 mg by mouth daily. 250 capsule 1  . ZYRTEC-D ALLERGY & CONGESTION 5-120 MG tablet TAKE 1 TABLET BY MOUTH ONCE DAILY AS NEEDED FOR ALLERGIES 24 tablet 0   No current facility-administered medications on file prior to visit.    BP (!) 140/93 (BP Location: Left Arm, Patient Position: Sitting)   Pulse 73   Temp 98.2 F (36.8 C) (Oral)   Ht 5\' 5"  (1.651 m)   Wt 146 lb 3.2 oz (66.3 kg)   SpO2 98%   BMI 24.33 kg/m      Objective:   Physical Exam  General Mental Status- Alert. General Appearance- Not in acute distress.   Skin General: Color- Normal Color. Moisture- Normal  Moisture.  Neck Carotid Arteries- Normal color. Moisture- Normal Moisture. No carotid bruits. No JVD.  Chest and Lung Exam Auscultation: Breath Sounds:-Normal.  Cardiovascular Auscultation:Rythm- Regular. Murmurs & Other Heart Sounds:Auscultation of the heart reveals- No Murmurs.  Abdomen Inspection:-Inspeection Normal. Palpation/Percussion:Note:No mass. Palpation and Percussion of the abdomen reveal- Non Tender, Non Distended + BS, no rebound or guarding.   Neurologic Cranial Nerve exam:- CN III-XII intact(No nystagmus), symmetric smile. Strength:- 5/5 equal and symmetric strength both upper and lower extremities.      Assessment & Plan:  For you wellness exam today I have ordered cbc, cmp and lipid panel.  For hx of prostate ca recheck psa.  For mild fatigue and decreased libido, ordered tsh, t4, vit d, b12, b1 and testosterone panel.  Flu vaccine today.  Recommend exercise and healthy diet.  We will let you know lab results as they come in.  Follow up date appointment will be determined after lab review.

## 2020-06-01 NOTE — Patient Instructions (Signed)
For you wellness exam today I have ordered cbc, cmp and lipid panel.  For hx of prostate ca recheck psa.  For mild fatigue and decreased libido, ordered tsh, t4, vit d, b12, b1 and testosterone panel.  Flu vaccine today.  Recommend exercise and healthy diet.  We will let you know lab results as they come in.  Follow up date appointment will be determined after lab review.     Preventive Care 31-59 Years Old, Male Preventive care refers to lifestyle choices and visits with your health care provider that can promote health and wellness. This includes:  A yearly physical exam. This is also called an annual well check.  Regular dental and eye exams.  Immunizations.  Screening for certain conditions.  Healthy lifestyle choices, such as eating a healthy diet, getting regular exercise, not using drugs or products that contain nicotine and tobacco, and limiting alcohol use. What can I expect for my preventive care visit? Physical exam Your health care provider will check:  Height and weight. These may be used to calculate body mass index (BMI), which is a measurement that tells if you are at a healthy weight.  Heart rate and blood pressure.  Your skin for abnormal spots. Counseling Your health care provider may ask you questions about:  Alcohol, tobacco, and drug use.  Emotional well-being.  Home and relationship well-being.  Sexual activity.  Eating habits.  Work and work Astronomer. What immunizations do I need?  Influenza (flu) vaccine  This is recommended every year. Tetanus, diphtheria, and pertussis (Tdap) vaccine  You may need a Td booster every 10 years. Varicella (chickenpox) vaccine  You may need this vaccine if you have not already been vaccinated. Zoster (shingles) vaccine  You may need this after age 59. Measles, mumps, and rubella (MMR) vaccine  You may need at least one dose of MMR if you were born in 1957 or later. You may also need a second  dose. Pneumococcal conjugate (PCV13) vaccine  You may need this if you have certain conditions and were not previously vaccinated. Pneumococcal polysaccharide (PPSV23) vaccine  You may need one or two doses if you smoke cigarettes or if you have certain conditions. Meningococcal conjugate (MenACWY) vaccine  You may need this if you have certain conditions. Hepatitis A vaccine  You may need this if you have certain conditions or if you travel or work in places where you may be exposed to hepatitis A. Hepatitis B vaccine  You may need this if you have certain conditions or if you travel or work in places where you may be exposed to hepatitis B. Haemophilus influenzae type b (Hib) vaccine  You may need this if you have certain risk factors. Human papillomavirus (HPV) vaccine  If recommended by your health care provider, you may need three doses over 6 months. You may receive vaccines as individual doses or as more than one vaccine together in one shot (combination vaccines). Talk with your health care provider about the risks and benefits of combination vaccines. What tests do I need? Blood tests  Lipid and cholesterol levels. These may be checked every 5 years, or more frequently if you are over 49 years old.  Hepatitis C test.  Hepatitis B test. Screening  Lung cancer screening. You may have this screening every year starting at age 41 if you have a 30-pack-year history of smoking and currently smoke or have quit within the past 15 years.  Prostate cancer screening. Recommendations will vary depending on  your family history and other risks.  Colorectal cancer screening. All adults should have this screening starting at age 55 and continuing until age 80. Your health care provider may recommend screening at age 52 if you are at increased risk. You will have tests every 1-10 years, depending on your results and the type of screening test.  Diabetes screening. This is done by  checking your blood sugar (glucose) after you have not eaten for a while (fasting). You may have this done every 1-3 years.  Sexually transmitted disease (STD) testing. Follow these instructions at home: Eating and drinking  Eat a diet that includes fresh fruits and vegetables, whole grains, lean protein, and low-fat dairy products.  Take vitamin and mineral supplements as recommended by your health care provider.  Do not drink alcohol if your health care provider tells you not to drink.  If you drink alcohol: ? Limit how much you have to 0-2 drinks a day. ? Be aware of how much alcohol is in your drink. In the [place] one drink equals one 12 oz bottle of beer (355 mL), one 5 oz glass of wine (148 mL), or one 1 oz glass of hard liquor (44 mL). Lifestyle  Take daily care of your teeth and gums.  Stay active. Exercise for at least 30 minutes on 5 or more days each week.  Do not use any products that contain nicotine or tobacco, such as cigarettes, e-cigarettes, and chewing tobacco. If you need help quitting, ask your health care provider.  If you are sexually active, practice safe sex. Use a condom or other form of protection to prevent STIs (sexually transmitted infections).  Talk with your health care provider about taking a low-dose aspirin every day starting at age 65. What's next?  Go to your health care provider once a year for a well check visit.  Ask your health care provider how often you should have your eyes and teeth checked.  Stay up to date on all vaccines. This information is not intended to replace advice given to you by your health care provider. Make sure you discuss any questions you have with your health care provider. Document Revised: 07/15/2018 Document Reviewed: 07/15/2018 Elsevier Patient Education  2020 [employer].

## 2020-06-01 NOTE — Addendum Note (Signed)
Addended by: Jeronimo Greaves on: 06/01/2020 08:39 AM   Modules accepted: Orders

## 2020-06-06 LAB — VITAMIN D 1,25 DIHYDROXY
Vitamin D 1, 25 (OH)2 Total: 62 pg/mL (ref 18–72)
Vitamin D2 1, 25 (OH)2: <8 pg/mL
Vitamin D3 1, 25 (OH)2: 62 pg/mL

## 2020-06-06 LAB — TESTOSTERONE TOTAL,FREE,BIO, MALES
Albumin: 4.4 g/dL (ref 3.6–5.1)
Sex Hormone Binding: 34 nmol/L (ref 22–77)
Testosterone, Bioavailable: 165 ng/dL (ref >=110.0)
Testosterone, Free: 82 pg/mL (ref 46.0–224.0)
Testosterone: 599 ng/dL (ref 250–827)

## 2020-06-06 LAB — CBC WITH DIFFERENTIAL/PLATELET
Absolute Monocytes: 358 cells/uL (ref 200–950)
Basophils Absolute: 38 cells/uL (ref 0–200)
Basophils Relative: 0.6 %
Eosinophils Absolute: 320 cells/uL (ref 15–500)
Eosinophils Relative: 5 %
HCT: 44.6 % (ref 38.5–50.0)
Hemoglobin: 15.7 g/dL (ref 13.2–17.1)
Lymphs Abs: 3539 cells/uL (ref 850–3900)
MCH: 31.8 pg (ref 27.0–33.0)
MCHC: 35.2 g/dL (ref 32.0–36.0)
MCV: 90.3 fL (ref 80.0–100.0)
MPV: 11.6 fL (ref 7.5–12.5)
Monocytes Relative: 5.6 %
Neutro Abs: 2144 cells/uL (ref 1500–7800)
Neutrophils Relative %: 33.5 %
Platelets: 221 10*3/uL (ref 140–400)
RBC: 4.94 10*6/uL (ref 4.20–5.80)
RDW: 12.5 % (ref 11.0–15.0)
Total Lymphocyte: 55.3 %
WBC: 6.4 10*3/uL (ref 3.8–10.8)

## 2020-06-06 LAB — T4, FREE: Free T4: 1.2 ng/dL (ref 0.8–1.8)

## 2020-06-06 LAB — VITAMIN B12: Vitamin B-12: 740 pg/mL (ref 200–1100)

## 2020-06-06 LAB — LIPID PANEL
Cholesterol: 213 mg/dL — ABNORMAL HIGH (ref <200)
HDL: 51 mg/dL (ref >=40)
LDL Cholesterol (Calc): 142 mg/dL (calc) — ABNORMAL HIGH
Non-HDL Cholesterol (Calc): 162 mg/dL (calc) — ABNORMAL HIGH (ref <130)
Total CHOL/HDL Ratio: 4.2 (calc) (ref <5.0)
Triglycerides: 95 mg/dL (ref <150)

## 2020-06-06 LAB — COMPREHENSIVE METABOLIC PANEL
AG Ratio: 1.7 (calc) (ref 1.0–2.5)
ALT: 18 U/L (ref 9–46)
AST: 20 U/L (ref 10–35)
Albumin: 4.4 g/dL (ref 3.6–5.1)
Alkaline phosphatase (APISO): 59 U/L (ref 35–144)
BUN: 14 mg/dL (ref 7–25)
CO2: 27 mmol/L (ref 20–32)
Calcium: 9.7 mg/dL (ref 8.6–10.3)
Chloride: 104 mmol/L (ref 98–110)
Creat: 0.92 mg/dL (ref 0.70–1.33)
Globulin: 2.6 g/dL (calc) (ref 1.9–3.7)
Glucose, Bld: 91 mg/dL (ref 65–99)
Potassium: 4.7 mmol/L (ref 3.5–5.3)
Sodium: 140 mmol/L (ref 135–146)
Total Bilirubin: 0.5 mg/dL (ref 0.2–1.2)
Total Protein: 7 g/dL (ref 6.1–8.1)

## 2020-06-06 LAB — TSH: TSH: 1.43 mIU/L (ref 0.40–4.50)

## 2020-06-06 LAB — PSA: PSA: <0.04 ng/mL (ref <=4.0)

## 2020-06-07 LAB — VITAMIN B1: Vitamin B1 (Thiamine): 19 nmol/L (ref 8–30)

## 2020-06-15 ENCOUNTER — Other Ambulatory Visit: Payer: Self-pay | Admitting: Family Medicine

## 2020-06-21 ENCOUNTER — Other Ambulatory Visit: Payer: Self-pay | Admitting: Medical

## 2020-07-19 ENCOUNTER — Other Ambulatory Visit: Payer: Self-pay | Admitting: Medical

## 2020-08-15 ENCOUNTER — Other Ambulatory Visit: Payer: Self-pay | Admitting: Medical

## 2020-09-21 ENCOUNTER — Other Ambulatory Visit: Payer: Self-pay

## 2020-09-21 ENCOUNTER — Other Ambulatory Visit: Payer: Self-pay | Admitting: Family Medicine

## 2020-10-02 ENCOUNTER — Other Ambulatory Visit: Payer: Self-pay | Admitting: Medical

## 2020-11-02 ENCOUNTER — Other Ambulatory Visit: Payer: Self-pay | Admitting: Family Medicine

## 2020-12-14 ENCOUNTER — Other Ambulatory Visit: Payer: Self-pay | Admitting: Medical

## 2021-01-10 ENCOUNTER — Other Ambulatory Visit: Payer: Self-pay | Admitting: Medical

## 2021-01-31 ENCOUNTER — Other Ambulatory Visit: Payer: Self-pay | Admitting: Medical

## 2021-02-13 IMAGING — DX DG LUMBAR SPINE 2-3V
3 series · 3 of 3 positions shown · non-contrast
Comparison: None.

CLINICAL DATA: Low back pain.  Bilateral hip pain.

EXAM:
LUMBAR SPINE - 2-3 VIEW

[l-spine ap]
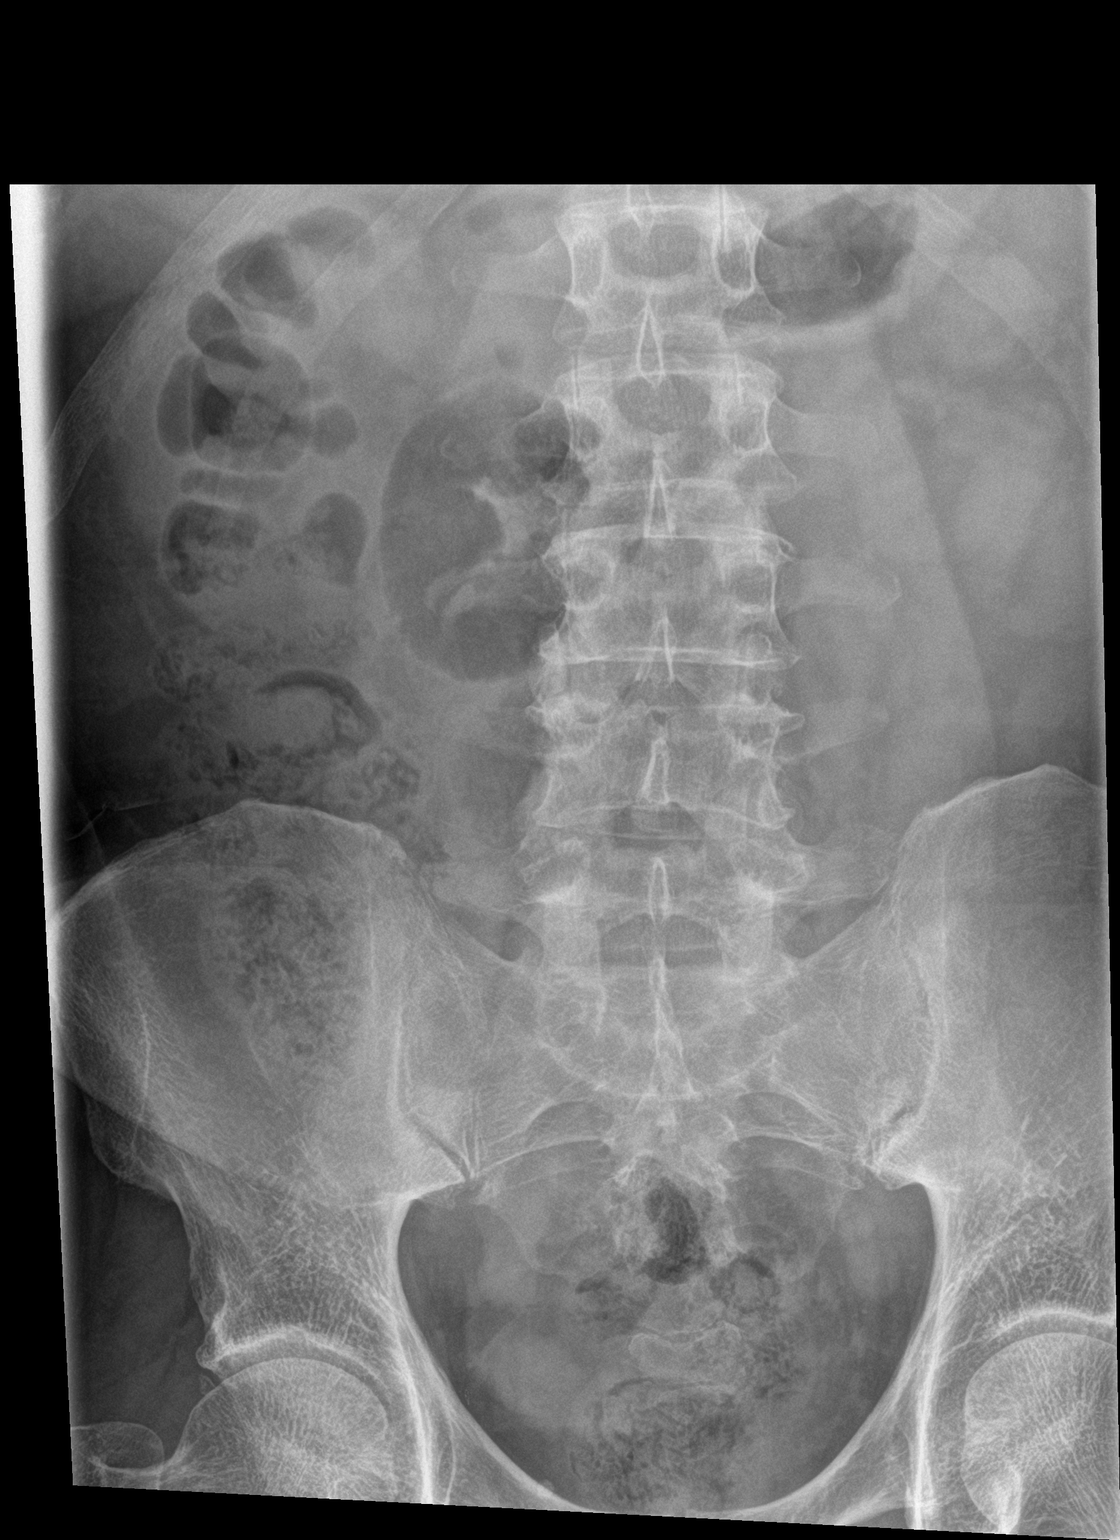

[l-spine lat]
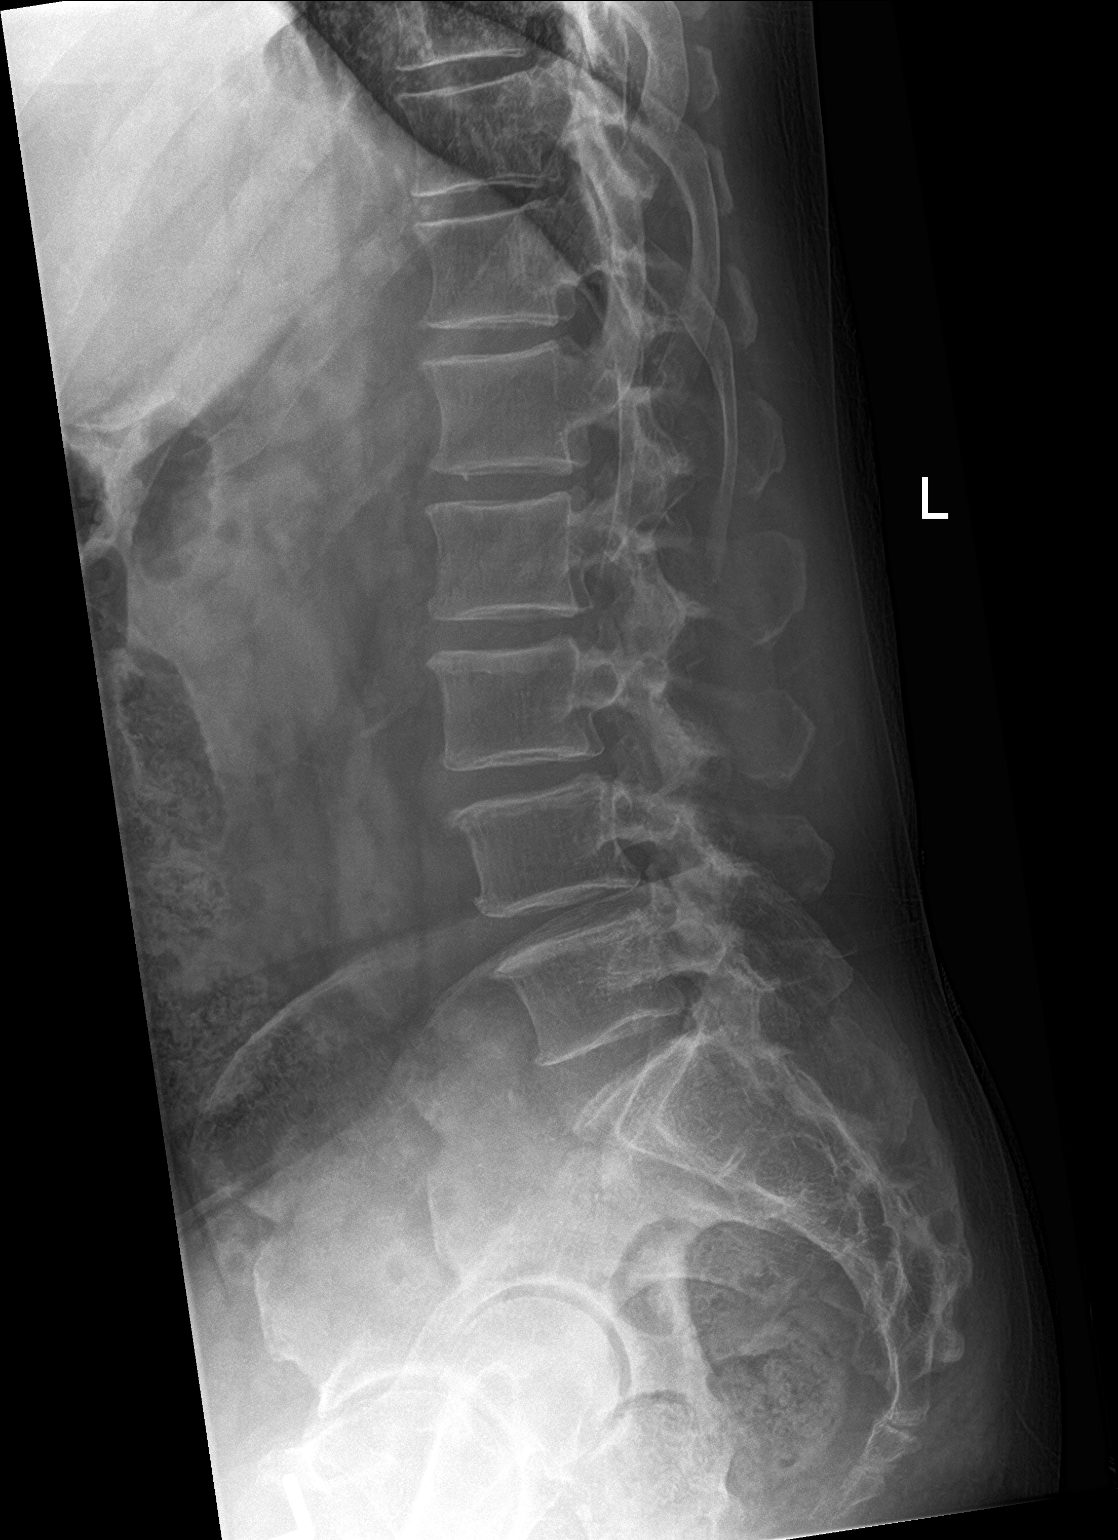

[l-spine spot]
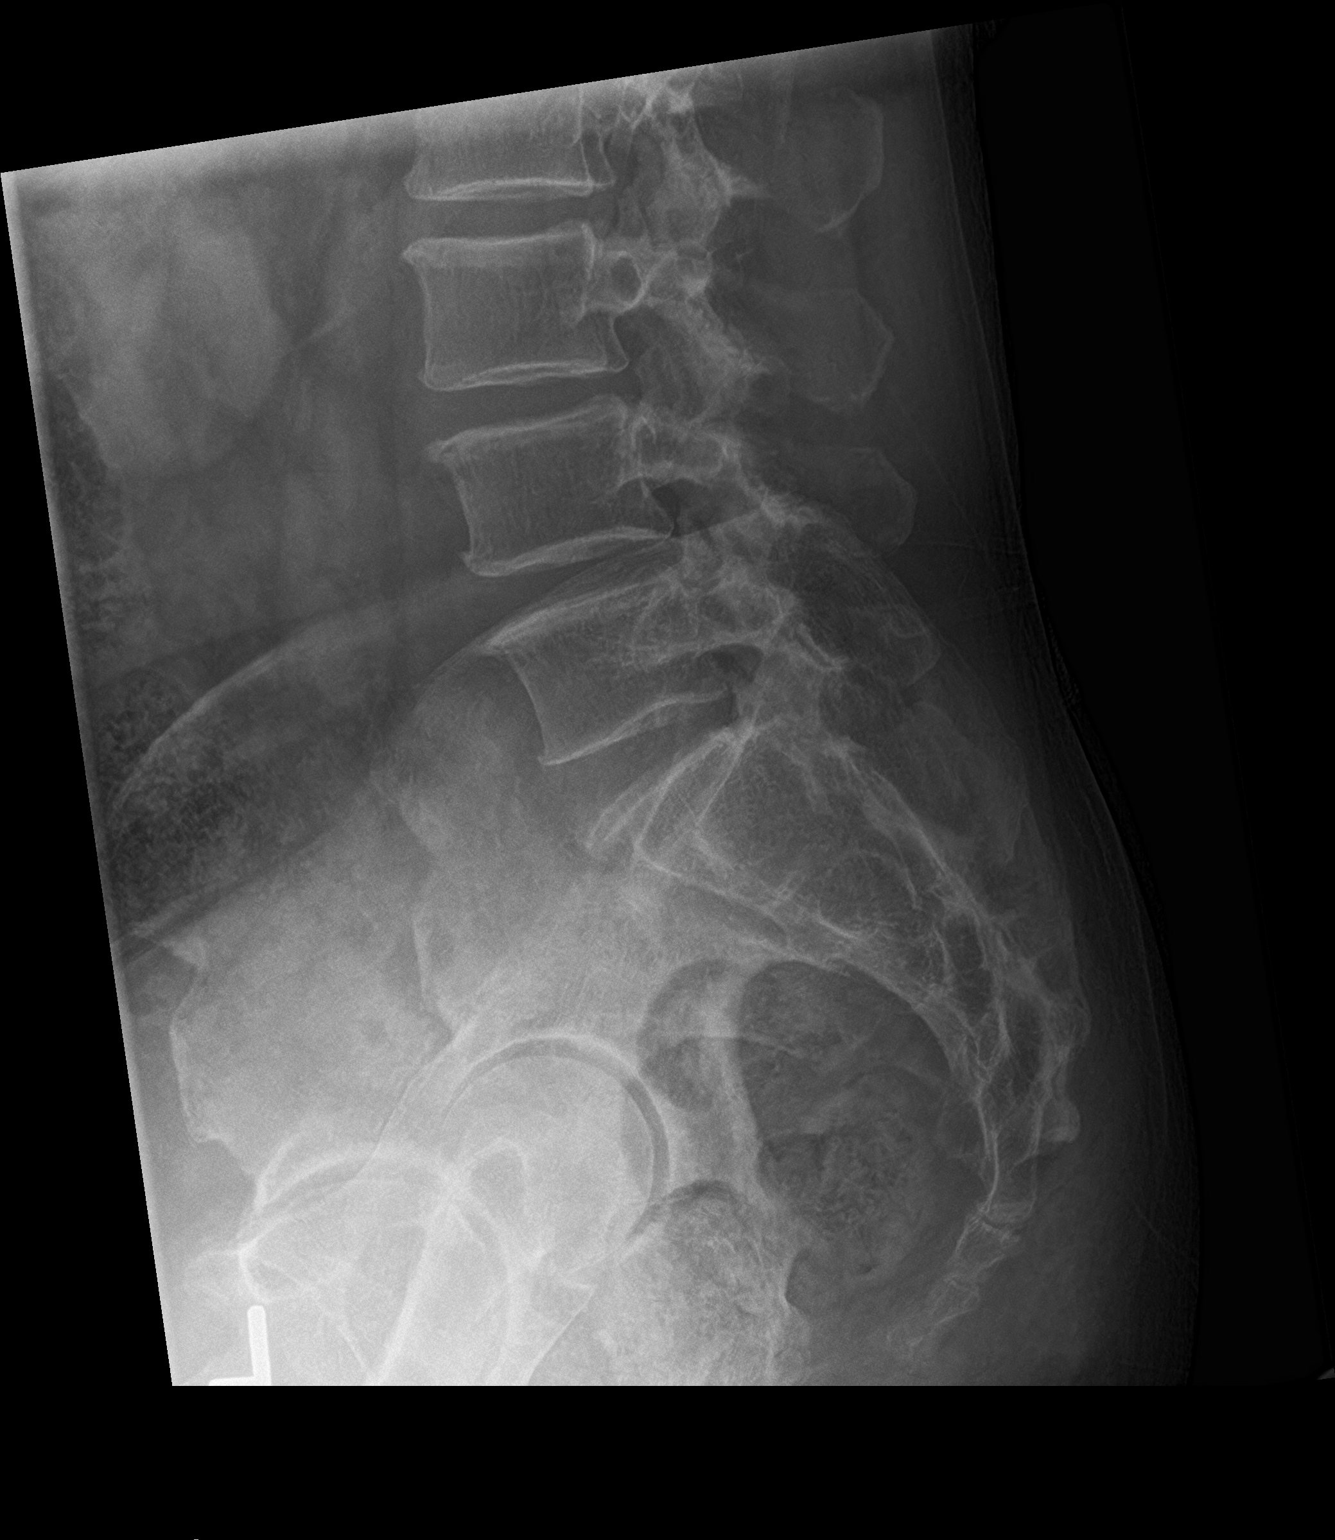

[3 of 3 positions shown; findings below may reference images not displayed]

FINDINGS: 5 nonrib bearing lumbar-type vertebral bodies.

Vertebral body heights are maintained. No acute fracture.
Generalized osteopenia.

Minimal grade 1 anterolisthesis of L3 on L4 secondary to facet
disease. No spondylolysis.

Mild degenerative disease with disc height loss at L3-4, L4-5 and
L5-S1 with bilateral facet arthropathy.

SI joints are unremarkable.
IMPRESSION: Mild lumbar spine spondylosis as described above.

No acute osseous injury of the lumbar spine.

## 2021-02-28 ENCOUNTER — Other Ambulatory Visit: Payer: Self-pay | Admitting: Medical

## 2021-04-18 ENCOUNTER — Telehealth: Payer: Self-pay | Admitting: Orthopedic Surgery

## 2021-04-18 NOTE — Telephone Encounter (Signed)
Pt called requesting a call back. Pt states he has medical questions about his upcoming appt. Please call pt at [phone].

## 2021-04-18 NOTE — Telephone Encounter (Signed)
Tried calling to discuss. No answer. LMVM advising was returning call to discuss. When patient calls back if I am not available, we need specific questions patient is asking to expedite his call.

## 2021-04-23 ENCOUNTER — Telehealth: Payer: Self-pay | Admitting: Orthopedic Surgery

## 2021-04-23 NOTE — Telephone Encounter (Signed)
Tried calling to discuss. No answer.

## 2021-04-23 NOTE — Telephone Encounter (Signed)
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   [phone]

## 2021-04-23 NOTE — Telephone Encounter (Signed)
Pt states his leg pain is throbbing in the lower and upper leg and knee. He is wondering if someone can call him   CB [phone]

## 2021-04-24 NOTE — Telephone Encounter (Signed)
Pt called again about the message that was left for him yesterday. He did not leave any specific questions for the nurse to answer but would like a call back. The best phone number is [phone].

## 2021-04-26 NOTE — Telephone Encounter (Signed)
Called and [facility] for patient stating we were returning his call

## 2021-04-30 ENCOUNTER — Telehealth: Payer: Self-pay | Admitting: Orthopedic Surgery

## 2021-04-30 ENCOUNTER — Telehealth: Payer: Self-pay | Admitting: Family Medicine

## 2021-04-30 NOTE — Telephone Encounter (Signed)
Patient is having right knee pain with numbness running down back of leg and and foot.  He did put in a call to his orthopedics and stated that he was unsure about who to call. Advised that orthopedic should be able to handle.  He has an appt scheduled for 10/5 and he will call them to see if there is anything that he can take until his office visit.

## 2021-04-30 NOTE — Telephone Encounter (Signed)
Pt calling stating that he does have an upcoming appt with Marlou Sa on 05/08/21 but wanted to speak to the nurse about a question prior to the appt. The best call back number is [phone]. Pt stated he has been trying to call for the past week and few days but hasn't gotten in touch with anyone.

## 2021-04-30 NOTE — Telephone Encounter (Signed)
Patient called regarding knee pain. Please contact patient back - [phone]. Please advise.

## 2021-04-30 NOTE — Telephone Encounter (Signed)
Patient unable to take call cause he is on a conference call and asked if we can call back in about 46mins.

## 2021-05-01 NOTE — Telephone Encounter (Signed)
Contacted patient and all questions and concerns have been addressed. He states that he has moved his appointment from 05/08/2021 to 05/02/2021 at 4:15

## 2021-05-02 ENCOUNTER — Ambulatory Visit: Payer: Self-pay

## 2021-05-02 ENCOUNTER — Other Ambulatory Visit: Payer: Self-pay

## 2021-05-02 ENCOUNTER — Ambulatory Visit (INDEPENDENT_AMBULATORY_CARE_PROVIDER_SITE_OTHER): Payer: BC Managed Care – PPO | Admitting: Orthopedic Surgery

## 2021-05-02 DIAGNOSIS — M79604 Pain in right leg: Secondary | ICD-10-CM

## 2021-05-02 DIAGNOSIS — M4316 Spondylolisthesis, lumbar region: Secondary | ICD-10-CM

## 2021-05-02 DIAGNOSIS — M1711 Unilateral primary osteoarthritis, right knee: Secondary | ICD-10-CM

## 2021-05-02 MED ORDER — PREDNISONE 10 MG (21) PO TBPK: ORAL_TABLET | ORAL | 0 refills | Status: DC

## 2021-05-04 ENCOUNTER — Encounter: Payer: Self-pay | Admitting: Orthopedic Surgery

## 2021-05-04 NOTE — Progress Notes (Signed)
Office Visit Note   Patient: John Lane           Date of Birth: 07/22/1961           MRN: [id number] Visit Date: 05/02/2021 Requested by: Mosie Lukes, MD [address] [place],  [place] [address] PCP: Mosie Lukes, MD  Subjective: Chief Complaint  Patient presents with  . Right Leg - Pain    HPI: John Lane is a 60 y.o. male who presents to the office complaining of right leg and knee pain.  Patient states that he helped his son move, loading furniture into a [employer] about 3 weeks ago and noticed pain within the next couple days.  He describes constant pain in the anterior right knee as well as pain that travels down the entirety of the right leg from the right buttock down to the posterior distal calf/ankle.  He has numbness and tingling through his whole leg when he is driving for long period of time in the last couple weeks.  Pain occasionally wakes him up at night.  In the last couple weeks he has noticed an occasional limp.  He has difficulty with stairs.  No history of previous knee or back surgery.  No major medical problems.  Denies any diabetes, smoking.  He did have history of prostate cancer surgery about a decade ago.  He has been trying to take Tylenol, ibuprofen, IcyHot, Voltaren without any significant lasting relief.  He does enjoy playing tennis.  Denies any significant lower back pain or any bowel/bladder incontinence or saddle anesthesia.              ROS: All systems reviewed are negative as they relate to the chief complaint within the history of present illness.  Patient denies fevers or chills.  Assessment & Plan: Visit Diagnoses:  1. Unilateral primary osteoarthritis, right knee   2. Pain in right leg   3. Spondylolisthesis at L3-L4 level     Plan: Patient is a 60 year old male who presents for evaluation of right leg pain.  This began after he helped his son move some furniture.  Denies any acute injury at the time but did notice it a day  or 2 later.  Most of his complaint is pain that causes him to limp in the anterior right knee as well as radicular pain down the posterior right leg from his buttock to his ankle.  Seems that he may have 2 processes going on with his right knee radiographs demonstrating arthritis of the right knee and a radicular component to his pain with the posterior leg pain/numbness/tingling.  Discussed options available to patient.  After discussion, plan to administer cortisone injection in the right knee today.  He had 20 cc aspirated from the knee and tolerated the cortisone injection well.  Plan to give this 2 days for him to see an effect and then start Medrol Dosepak on Saturday if he does not have complete relief from his symptoms for near complete relief.  He was encouraged to keep mental note or physical notes of how his pain responds.  Follow-up in 3 weeks for clinical recheck with Dr. Marlou Sa.  If continued symptoms, could consider MRI of the lumbar spine for further evaluation given his degenerative disc disease and low-grade spondylolisthesis.  Follow-Up Instructions: No follow-ups on file.   Orders:  Orders Placed This Encounter  Procedures  . XR Knee 1-2 Views Right  . XR Lumbar Spine  2-3 Views   Meds ordered this encounter  Medications  . predniSONE (STERAPRED UNI-PAK 21 TAB) 10 MG (21) TBPK tablet    Sig: Take as directed on package    Dispense:  21 tablet    Refill:  0      Procedures: Large Joint Inj: R knee on 05/02/2021 12:28 PM Indications: diagnostic evaluation, joint swelling and pain Details: 18 G 1.5 in needle, superolateral approach  Arthrogram: No  Medications: 5 mL lidocaine 1 %; 40 mg methylPREDNISolone acetate 40 MG/ML; 4 mL bupivacaine 0.25 % Aspirate: 20 mL Outcome: tolerated well, no immediate complications Procedure, treatment alternatives, risks and benefits explained, specific risks discussed. Consent was given by the patient. Immediately prior to procedure a time  out was called to verify the correct patient, procedure, equipment, support staff and site/side marked as required. Patient was prepped and draped in the usual sterile fashion.      Clinical Data: No additional findings.  Objective: Vital Signs: There were no vitals taken for this visit.  Physical Exam:  Constitutional: Patient appears well-developed HEENT:  Head: Normocephalic Eyes:EOM are normal Neck: Normal range of motion Cardiovascular: Normal rate Pulmonary/chest: Effort normal Neurologic: Patient is alert Skin: Skin is warm Psychiatric: Patient has normal mood and affect  Ortho Exam: Ortho exam demonstrates right knee with positive effusion.  Tenderness over the medial joint line moderately and lateral joint line mildly.  Able to perform straight leg raise.  Range of motion to 3 degrees of extension and 115 degrees of knee flexion.  No calf tenderness.  Negative Homans' sign.  No pain with hip range of motion.  Negative Stinchfield sign.  Negative straight leg raise.  5/5 motor strength of bilateral hip flexion, quadricep, hamstring, dorsiflexion, flexion.  2+ patellar tendon reflexes bilaterally.  No evidence of clonus bilaterally.  No tenderness throughout the lumbar spine.  Specialty Comments:  No specialty comments available.  Imaging: No results found.   PMFS History: Patient Active Problem List   Diagnosis Date Noted  . H/O adenomatous polyp of colon 05/10/2018  . Hip pain, bilateral 04/13/2017  . Pain in joint, shoulder region 02/11/2015  . Allergic state 02/11/2015  . Knee pain, bilateral 01/18/2014  . Hyperlipidemia 01/18/2014  . Ear pain 06/02/2013  . Prostate cancer ([place]) 12/24/2011  . Preventative health care 08/28/2011  . H/O prostate cancer 08/28/2011  . 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 BAKER'S CYST, RIGHT KNEE 04/14/2008  . TINEA BARBAE 10/07/2007   Past Medical History:  Diagnosis Date  . Allergic state 02/11/2015  . Allergy   .  H/O hematuria    2003  . H/O prostate cancer 08/28/2011  . Other and unspecified hyperlipidemia 01/18/2014  . 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 Heart disease Brother        MI s/p stent  . Diabetes Father   . Hypertension Father   . Heart disease Father   . Arthritis Father   . Hypertension Mother   . Stroke Mother 52       left arm weakness, speech improving,   . Gout Mother   . Arthritis Sister   . Arthritis Sister   . Cancer Sister   . Hypertension Brother   . Hearing loss Brother   . Hearing loss Son   . Heart disease Brother        MI  . Hypertension Brother   .  Colon cancer Neg Hx   . Esophageal cancer Neg Hx   . Rectal cancer Neg Hx   . Stomach cancer Neg Hx     Past Surgical History:  Procedure Laterality Date  . APPENDECTOMY    . CIRCUMCISION  05/12/2012   Procedure: CIRCUMCISION ADULT;  Surgeon: Bernestine Amass, MD;  Location: [facility];  Service: Urology;  Laterality: N/A;  . ROBOT ASSISTED LAPAROSCOPIC RADICAL PROSTATECTOMY  05/12/2012   Procedure: ROBOTIC ASSISTED LAPAROSCOPIC RADICAL PROSTATECTOMY;  Surgeon: Bernestine Amass, MD;  Location: [facility];  Service: Urology;  Laterality: N/A;  . TONSILLECTOMY     Social History   Occupational History  . Not on file  Tobacco Use  . Smoking status: Former    Packs/day: 0.50    Years: 10.00    Pack years: 5.00    Types: Cigarettes    Quit date: 08/05/1991    Years since quitting: 29.7  . Smokeless tobacco: Never  Substance and Sexual Activity  . Alcohol use: Yes    Alcohol/week: 2.0 standard drinks    Types: 2 Standard drinks or equivalent per week    Comment: 1-2 weekly  . Drug use: No  . 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

## 2021-05-06 MED ORDER — BUPIVACAINE HCL 0.25 % IJ SOLN
4.0000 mL | INTRAMUSCULAR | Status: AC | PRN
  Administered 2021-05-02: 4 mL via INTRA_ARTICULAR

## 2021-05-06 MED ORDER — LIDOCAINE HCL 1 % IJ SOLN
5.0000 mL | INTRAMUSCULAR | Status: AC | PRN
  Administered 2021-05-02: 5 mL

## 2021-05-06 MED ORDER — METHYLPREDNISOLONE ACETATE 40 MG/ML IJ SUSP
40.0000 mg | INTRAMUSCULAR | Status: AC | PRN
  Administered 2021-05-02: 40 mg via INTRA_ARTICULAR

## 2021-05-08 ENCOUNTER — Ambulatory Visit: Payer: BC Managed Care – PPO | Admitting: Orthopedic Surgery

## 2021-05-22 ENCOUNTER — Ambulatory Visit: Payer: BC Managed Care – PPO | Admitting: Orthopedic Surgery

## 2021-05-27 ENCOUNTER — Ambulatory Visit: Payer: BC Managed Care – PPO | Attending: Internal Medicine

## 2021-05-27 DIAGNOSIS — Z23 Encounter for immunization: Secondary | ICD-10-CM

## 2021-05-27 NOTE — Progress Notes (Signed)
   Covid-19 Vaccination Clinic  Name:  AASIR DAIGLER    MRN: [id number] DOB: 03/01/1961  05/27/2021  Mr. Encina was observed post Covid-19 immunization for 15 minutes without incident. He was provided with Vaccine Information Sheet and instruction to access the V-Safe system.   Mr. Loewen was instructed to call 911 with any severe reactions post vaccine: Difficulty breathing  Swelling of face and throat  A fast heartbeat  A bad rash all over body  Dizziness and weakness   Immunizations Administered     Name Date Dose VIS Date Route   Pfizer Covid-19 Vaccine Bivalent Booster 05/27/2021 10:13 AM 0.3 mL 04/03/2021 Intramuscular   Manufacturer: [place]   Lot: [id number]   [place]: [phone]

## 2021-06-06 ENCOUNTER — Encounter: Payer: BC Managed Care – PPO | Admitting: Medical

## 2021-06-10 ENCOUNTER — Other Ambulatory Visit (HOSPITAL_BASED_OUTPATIENT_CLINIC_OR_DEPARTMENT_OTHER): Payer: Self-pay

## 2021-06-10 MED ORDER — INFLUENZA VAC SPLIT QUAD 0.5 ML IM SUSY
PREFILLED_SYRINGE | INTRAMUSCULAR | 0 refills | Status: DC
  Filled 2021-06-10: qty 0.5, 1d supply, fill #0

## 2021-06-11 ENCOUNTER — Telehealth: Payer: Self-pay | Admitting: Family Medicine

## 2021-06-11 ENCOUNTER — Other Ambulatory Visit: Payer: Self-pay

## 2021-06-11 MED ORDER — ZYRTEC-D ALLERGY & CONGESTION 5-120 MG PO TB12: ORAL_TABLET | ORAL | 3 refills | Status: DC

## 2021-06-11 NOTE — Telephone Encounter (Signed)
Medication sent.

## 2021-06-11 NOTE — Telephone Encounter (Signed)
PT has CPE on 06/14/21   Medication: ZYRTEC-D ALLERGY & CONGESTION 5-120 MG tablet  Has the patient contacted their pharmacy? Yes.   (If no, request that the patient contact the pharmacy for the refill.) (If yes, when and what did the pharmacy advise?)  Preferred Pharmacy (with phone number or street name): [place], [place] - [address]  [place], [place] [address]  Phone:  [phone]  Fax:  [phone]  Agent: Please be advised that RX refills may take up to 3 business days. We ask that you follow-up with your pharmacy.

## 2021-06-14 ENCOUNTER — Encounter: Payer: Self-pay | Admitting: Medical

## 2021-06-14 ENCOUNTER — Ambulatory Visit (INDEPENDENT_AMBULATORY_CARE_PROVIDER_SITE_OTHER): Payer: BC Managed Care – PPO | Admitting: Medical

## 2021-06-14 ENCOUNTER — Other Ambulatory Visit: Payer: Self-pay

## 2021-06-14 VITALS — BP 125/80 | HR 85 | Temp 98.1°F | Resp 18 | Ht 65.0 in | Wt 143.0 lb

## 2021-06-14 DIAGNOSIS — Z125 Encounter for screening for malignant neoplasm of prostate: Secondary | ICD-10-CM

## 2021-06-14 DIAGNOSIS — Z23 Encounter for immunization: Secondary | ICD-10-CM | POA: Diagnosis not present

## 2021-06-14 DIAGNOSIS — Z0000 Encounter for general adult medical examination without abnormal findings: Secondary | ICD-10-CM | POA: Diagnosis not present

## 2021-06-14 LAB — CBC WITH DIFFERENTIAL/PLATELET
Basophils Absolute: 0 10*3/uL (ref 0.0–0.1)
Basophils Relative: 0.4 % (ref 0.0–3.0)
Eosinophils Absolute: 0.2 10*3/uL (ref 0.0–0.7)
Eosinophils Relative: 3.6 % (ref 0.0–5.0)
HCT: 45 % (ref 39.0–52.0)
Hemoglobin: 15.2 g/dL (ref 13.0–17.0)
Lymphocytes Relative: 54.7 % — ABNORMAL HIGH (ref 12.0–46.0)
Lymphs Abs: 3.4 10*3/uL (ref 0.7–4.0)
MCHC: 33.8 g/dL (ref 30.0–36.0)
MCV: 89.7 fl (ref 78.0–100.0)
Monocytes Absolute: 0.3 10*3/uL (ref 0.1–1.0)
Monocytes Relative: 5.6 % (ref 3.0–12.0)
Neutro Abs: 2.2 10*3/uL (ref 1.4–7.7)
Neutrophils Relative %: 35.7 % — ABNORMAL LOW (ref 43.0–77.0)
Platelets: 247 10*3/uL (ref 150.0–400.0)
RBC: 5.01 Mil/uL (ref 4.22–5.81)
RDW: 12.5 % (ref 11.5–15.5)
WBC: 6.2 10*3/uL (ref 4.0–10.5)

## 2021-06-14 LAB — COMPREHENSIVE METABOLIC PANEL
ALT: 16 U/L (ref 0–53)
AST: 19 U/L (ref 0–37)
Albumin: 4.4 g/dL (ref 3.5–5.2)
Alkaline Phosphatase: 54 U/L (ref 39–117)
BUN: 15 mg/dL (ref 6–23)
CO2: 31 mEq/L (ref 19–32)
Calcium: 9.5 mg/dL (ref 8.4–10.5)
Chloride: 102 mEq/L (ref 96–112)
Creatinine, Ser: 0.96 mg/dL (ref 0.40–1.50)
GFR: 85.86 mL/min (ref >60.00)
Glucose, Bld: 88 mg/dL (ref 70–99)
Potassium: 4.7 mEq/L (ref 3.5–5.1)
Sodium: 139 mEq/L (ref 135–145)
Total Bilirubin: 0.9 mg/dL (ref 0.2–1.2)
Total Protein: 6.8 g/dL (ref 6.0–8.3)

## 2021-06-14 LAB — LIPID PANEL
Cholesterol: 214 mg/dL — ABNORMAL HIGH (ref 0–200)
HDL: 53.2 mg/dL (ref >39.00)
LDL Cholesterol: 144 mg/dL — ABNORMAL HIGH (ref 0–99)
NonHDL: 161.17
Total CHOL/HDL Ratio: 4
Triglycerides: 88 mg/dL (ref 0.0–149.0)
VLDL: 17.6 mg/dL (ref 0.0–40.0)

## 2021-06-14 LAB — PSA: PSA: 0 ng/mL — ABNORMAL LOW (ref 0.10–4.00)

## 2021-06-14 MED ORDER — ATORVASTATIN CALCIUM 10 MG PO TABS: 10.0000 mg | ORAL_TABLET | Freq: Every day | ORAL | 3 refills | Status: DC

## 2021-06-14 NOTE — Addendum Note (Signed)
Addended by: Anabel Halon on: 06/14/2021 04:46 PM   Modules accepted: Orders

## 2021-06-14 NOTE — Progress Notes (Signed)
Subjective:    Patient ID: John Lane, male    DOB: 1960-10-31, 60 y.o.   MRN: [id number]  HPI  Pt in for cpe/wellness exam.   Pt is fasting.      Pt states he has been exercising little bit. Doing some push ups and abdomen exercise. Pt states he walks a lot at work. He does not know how many steps he gets on average. Pt states moderate healthy diet. Avoid fried foods. Rare/random occasional beer. 2 cups of coffee a day. Non smoker   Pt will get flu vaccine today.     Prostate surgery 8 years ago. Continues to follow up with urologist.    Up to date on colonoscopy. On review appears wil need to repeat 07-2022.    Review of Systems  Constitutional:  Negative for chills, fatigue and fever.  HENT:  Negative for congestion, ear discharge, ear pain, nosebleeds, rhinorrhea and trouble swallowing.   Respiratory:  Negative for cough, chest tightness, shortness of breath and wheezing.   Cardiovascular:  Negative for chest pain and palpitations.  Gastrointestinal:  Negative for abdominal pain, diarrhea, nausea and rectal pain.  Musculoskeletal:  Negative for back pain.  Skin:  Negative for rash.  Neurological:  Negative for dizziness, syncope, weakness and numbness.  Hematological:  Negative for adenopathy. Does not bruise/bleed easily.  Psychiatric/Behavioral:  Negative for behavioral problems, dysphoric mood, self-injury and suicidal ideas. The patient is not nervous/anxious.     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     Social History   Socioeconomic History   Marital status: Married    Spouse name: Not on file   Number of children: Not on file   Years of education: Not on file   Highest education level: Not on file  Occupational History   Not on file  Tobacco  Use   Smoking status: Former    Packs/day: 0.50    Years: 10.00    Pack years: 5.00    Types: Cigarettes    Quit date: 08/05/1991    Years since quitting: 29.8   Smokeless tobacco: Never  Substance and Sexual Activity   Alcohol use: Yes    Alcohol/week: 2.0 standard drinks    Types: 2 Standard drinks or equivalent per week    Comment: 1-2 weekly   Drug use: No   Sexual activity: Yes    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  Other Topics Concern   Not on file  Social History Narrative   Married   Former smoker   Quit over 18 years ago-smoked for 10 years 1/2 ppd   3 children   Social Determinants of Health   Financial Resource Strain: Not on file  Food Insecurity: Not on file  Transportation Needs: Not on file  Physical Activity: Not on file  Stress: Not on file  Social Connections: Not on file  Intimate Partner Violence: Not on file    Past Surgical History:  Procedure Laterality Date   APPENDECTOMY     CIRCUMCISION  05/12/2012   Procedure: CIRCUMCISION ADULT;  Surgeon: Bernestine Amass, MD;  Location: [facility];  Service: Urology;  Laterality: N/A;   ROBOT ASSISTED LAPAROSCOPIC RADICAL PROSTATECTOMY  05/12/2012   Procedure: ROBOTIC ASSISTED LAPAROSCOPIC RADICAL PROSTATECTOMY;  Surgeon: Bernestine Amass, MD;  Location: [facility];  Service: Urology;  Laterality: N/A;   TONSILLECTOMY      Family History  Problem Relation Age of Onset   Heart disease Brother        MI s/p stent   Diabetes Father    Hypertension Father    Heart disease Father    Arthritis Father    Hypertension Mother    Stroke Mother 18       left arm weakness, speech improving,    Gout Mother    Arthritis Sister    Arthritis Sister    Cancer Sister    Hypertension Brother    Hearing loss Brother    Hearing loss Son    Heart disease Brother        MI   Hypertension Brother    Colon cancer Neg Hx    Esophageal cancer Neg Hx    Rectal cancer  Neg Hx    Stomach cancer Neg Hx     No Known Allergies  Current Outpatient Medications on File Prior to Visit  Medication Sig Dispense Refill   acetaminophen (TYLENOL) 500 MG tablet Take 1 tablet (500 mg total) by mouth 3 (three) times daily as needed. 90 tablet 5   BAYER ASPIRIN EC LOW DOSE 81 MG EC tablet TAKE ONE TABLET BY MOUTH ONCE DAILY AS NEEDED FOR PAIN 400 tablet 0   benzonatate (TESSALON) 100 MG capsule Take 1 capsule (100 mg total) by mouth 3 (three) times daily as needed. 30 capsule 0   bismuth subsalicylate (PEPTO BISMOL) 262 MG/15ML suspension Take 30 mLs by mouth every 6 (six) hours as needed. 360 mL 3   budesonide (RHINOCORT AQUA) 32 MCG/ACT nasal spray Place 1 spray into both nostrils daily. 8.6 g 5   Camphor-Menthol-Methyl Sal 1.2-5.7-6.3 % PTCH Apply 1 patch topically 2 (two) times daily. 40 patch 1   Cholecalciferol (VITAMIN D PO) Take by mouth.     cyclobenzaprine (FLEXERIL) 10 MG tablet Take 1 tablet (10 mg total) by mouth at bedtime. 10 tablet 0   diclofenac (VOLTAREN) 75 MG EC tablet Take 1 tablet by mouth twice daily 20 tablet 0   FLOVENT HFA 110 MCG/ACT inhaler INHALE 2 PUFFS TWICE DAILY 36 Inhaler 1   fluticasone (FLONASE) 50 MCG/ACT nasal spray Place 2 sprays into both nostrils daily. 48 g 3   ibuprofen (ADVIL,MOTRIN) 200 MG tablet Take 1 tablet (200 mg total) by mouth 3 (three) times daily as needed. Advil Liqui-gel 240 tablet 0   influenza vac split quadrivalent PF (FLUARIX) 0.5 ML injection Inject into the muscle. 0.5 mL 0   Krill Oil 500 MG CAPS Take 1 capsule (500 mg total) by mouth daily. 160 capsule 6   meloxicam (MOBIC) 15 MG tablet 1 po q d prn 30 tablet 1   Multiple Vitamins-Minerals (AIRBORNE GUMMIES PO)      Multiple Vitamins-Minerals (GNP MEGA MULTI FOR MEN) TABS GNC Mega Men Performance & Vitality 30 packs. Take as directed 30 tablet 1   naproxen sodium (ANAPROX) 220 MG tablet TAKE ONE CAPSULE BY MOUTH TWICE DAILY AS NEEDED 160 tablet 6    olopatadine (PATANOL) 0.1 % ophthalmic solution INSTILL 1 DROP IN EACH EYE TWICE DAILY AS DIRECTED 15 mL 1   predniSONE (STERAPRED UNI-PAK 21 TAB) 10 MG (21) TBPK tablet Take as directed on package 21 tablet 0   Saline (SIMPLY SALINE) 0.9 % AERS  Place 1 spray into the nose daily. 378 mL 2   sildenafil (VIAGRA) 100 MG tablet Take 0.5-1 tablets (50-100 mg total) by mouth daily as needed for erectile dysfunction. 9 tablet 3   traMADol (ULTRAM) 50 MG tablet Take 1 tablet (50 mg total) by mouth every 8 (eight) hours as needed. 15 tablet 0   triamcinolone (NASACORT ALLERGY 24HR) 55 MCG/ACT AERO nasal inhaler Place 2 sprays into the nose daily. 1 Inhaler 12   Turmeric Curcumin 500 MG CAPS Take 500 mg by mouth daily. 250 capsule 1   ZYRTEC-D ALLERGY & CONGESTION 5-120 MG tablet TAKE 1 TABLET BY MOUTH ONCE DAILY AS NEEDED FOR ALLERGIES 30 tablet 3   No current facility-administered medications on file prior to visit.    BP 125/80   Pulse 85   Temp 98.1 F (36.7 C)   Resp 18   Ht 5\' 5"  (1.651 m)   Wt 143 lb (64.9 kg)   SpO2 94%   BMI 23.80 kg/m         Objective:   Physical Exam  General Mental Status- Alert. General Appearance- Not in acute distress.   Skin General: Color- Normal Color. Moisture- Normal Moisture.  Neck Carotid Arteries- Normal color. Moisture- Normal Moisture. No carotid bruits. No JVD.  Chest and Lung Exam Auscultation: Breath Sounds:-Normal.  Cardiovascular Auscultation:Rythm- Regular. Murmurs & Other Heart Sounds:Auscultation of the heart reveals- No Murmurs.  Abdomen Inspection:-Inspeection Normal. Palpation/Percussion:Note:No mass. Palpation and Percussion of the abdomen reveal- Non Tender, Non Distended + BS, no rebound or guarding.   Neurologic Cranial Nerve exam:- CN III-XII intact(No nystagmus), symmetric smile. Strength:- 5/5 equal and symmetric strength both upper and lower extremities.       Assessment & Plan:   For you wellness exam today  I have ordered cbc, cmp, lipid panel and screening psa.  Vaccine today flu vaccine.  Recommend exercise and healthy diet.  We will let you know lab results as they come in.  Follow up date appointment will be determined after lab review.   Mackie Pai, PA-C

## 2021-06-14 NOTE — Patient Instructions (Addendum)
For you wellness exam today I have ordered cbc, cmp, lipid panel and screening psa.  Vaccine today flu vaccine. Consider shingles vaccine. Check with your insurance if covered.  Recommend exercise and healthy diet.  We will let you know lab results as they come in.  Follow up date appointment will be determined after lab review.     Preventive Care 62-60 Years Old, Male Preventive care refers to lifestyle choices and visits with your health care provider that can promote health and wellness. Preventive care visits are also called wellness exams. What can I expect for my preventive care visit? Counseling During your preventive care visit, your health care provider may ask about your: Medical history, including: Past medical problems. Family medical history. Current health, including: Emotional well-being. Home life and relationship well-being. Sexual activity. Lifestyle, including: Alcohol, nicotine or tobacco, and drug use. Access to firearms. Diet, exercise, and sleep habits. Safety issues such as seatbelt and bike helmet use. Sunscreen use. Work and work Statistician. Physical exam Your health care provider will check your: Height and weight. These may be used to calculate your BMI (body mass index). BMI is a measurement that tells if you are at a healthy weight. Waist circumference. This measures the distance around your waistline. This measurement also tells if you are at a healthy weight and may help predict your risk of certain diseases, such as type 2 diabetes and high blood pressure. Heart rate and blood pressure. Body temperature. Skin for abnormal spots. What immunizations do I need? Vaccines are usually given at various ages, according to a schedule. Your health care provider will recommend vaccines for you based on your age, medical history, and lifestyle or other factors, such as travel or where you work. What tests do I need? Screening Your health care provider  may recommend screening tests for certain conditions. This may include: Lipid and cholesterol levels. Diabetes screening. This is done by checking your blood sugar (glucose) after you have not eaten for a while (fasting). Hepatitis B test. Hepatitis C test. HIV (human immunodeficiency virus) test. STI (sexually transmitted infection) testing, if you are at risk. Lung cancer screening. Prostate cancer screening. Colorectal cancer screening. Talk with your health care provider about your test results, treatment options, and if necessary, the need for more tests. Follow these instructions at home: Eating and drinking  Eat a diet that includes fresh fruits and vegetables, whole grains, lean protein, and low-fat dairy products. Take vitamin and mineral supplements as recommended by your health care provider. Do not drink alcohol if your health care provider tells you not to drink. If you drink alcohol: Limit how much you have to 0-2 drinks a day. Know how much alcohol is in your drink. In the [place] one drink equals one 12 oz bottle of beer (355 mL), one 5 oz glass of wine (148 mL), or one 1 oz glass of hard liquor (44 mL). Lifestyle Brush your teeth every morning and night with fluoride toothpaste. Floss one time each day. Exercise for at least 30 minutes 5 or more days each week. Do not use any products that contain nicotine or tobacco. These products include cigarettes, chewing tobacco, and vaping devices, such as e-cigarettes. If you need help quitting, ask your health care provider. Do not use drugs. If you are sexually active, practice safe sex. Use a condom or other form of protection to prevent STIs. Take aspirin only as told by your health care provider. Make sure that you understand how much  to take and what form to take. Work with your health care provider to find out whether it is safe and beneficial for you to take aspirin daily. Find healthy ways to manage stress, such  as: Meditation, yoga, or listening to music. Journaling. Talking to a trusted person. Spending time with friends and family. Minimize exposure to UV radiation to reduce your risk of skin cancer. Safety Always wear your seat belt while driving or riding in a vehicle. Do not drive: If you have been drinking alcohol. Do not ride with someone who has been drinking. When you are tired or distracted. While texting. If you have been using any mind-altering substances or drugs. Wear a helmet and other protective equipment during sports activities. If you have firearms in your house, make sure you follow all gun safety procedures. What's next? Go to your health care provider once a year for an annual wellness visit. Ask your health care provider how often you should have your eyes and teeth checked. Stay up to date on all vaccines. This information is not intended to replace advice given to you by your health care provider. Make sure you discuss any questions you have with your health care provider. Document Revised: 01/16/2021 Document Reviewed: 01/16/2021 Elsevier Patient Education  [place].

## 2021-06-21 ENCOUNTER — Other Ambulatory Visit (HOSPITAL_BASED_OUTPATIENT_CLINIC_OR_DEPARTMENT_OTHER): Payer: Self-pay

## 2021-06-21 MED ORDER — PFIZER COVID-19 VAC BIVALENT 30 MCG/0.3ML IM SUSP
INTRAMUSCULAR | 0 refills | Status: DC
  Filled 2021-06-21: qty 0.3, 1d supply, fill #0

## 2021-07-03 ENCOUNTER — Other Ambulatory Visit: Payer: Self-pay | Admitting: Orthopedic Surgery

## 2021-07-04 DEATH — deceased

## 2021-08-21 ENCOUNTER — Other Ambulatory Visit: Payer: Self-pay | Admitting: Orthopedic Surgery

## 2021-12-09 ENCOUNTER — Telehealth: Payer: Self-pay | Admitting: Family Medicine

## 2021-12-09 NOTE — Telephone Encounter (Signed)
Patient would like to know if Dr. Charlett Blake could start prescribing him Tadalafil per the request of his urologist. Please advise. ?

## 2021-12-10 ENCOUNTER — Other Ambulatory Visit: Payer: Self-pay

## 2021-12-10 MED ORDER — TADALAFIL 5 MG PO TABS: 5.0000 mg | ORAL_TABLET | Freq: Every day | ORAL | 2 refills | Status: DC | PRN

## 2021-12-10 NOTE — Telephone Encounter (Signed)
Refill sent.

## 2021-12-25 ENCOUNTER — Other Ambulatory Visit: Payer: Self-pay | Admitting: Family Medicine

## 2022-02-05 ENCOUNTER — Other Ambulatory Visit: Payer: Self-pay | Admitting: Surgical

## 2022-02-05 ENCOUNTER — Other Ambulatory Visit: Payer: Self-pay | Admitting: Family Medicine

## 2022-03-10 ENCOUNTER — Other Ambulatory Visit: Payer: Self-pay | Admitting: Family Medicine

## 2022-04-03 ENCOUNTER — Other Ambulatory Visit: Payer: Self-pay | Admitting: Family Medicine

## 2022-04-03 ENCOUNTER — Other Ambulatory Visit: Payer: Self-pay | Admitting: Surgical

## 2022-04-04 ENCOUNTER — Other Ambulatory Visit: Payer: Self-pay

## 2022-04-04 ENCOUNTER — Telehealth: Payer: Self-pay | Admitting: Family Medicine

## 2022-04-04 MED ORDER — ZYRTEC-D ALLERGY & SINUS 5-120 MG PO TB12
ORAL_TABLET | ORAL | 0 refills | Status: DC
Start: 2022-04-04 — End: 2022-05-21

## 2022-04-04 NOTE — Telephone Encounter (Signed)
Medication was sent

## 2022-04-04 NOTE — Telephone Encounter (Signed)
Medication: ZYRTEC-D ALLERGY & SINUS 5-120 MG tablet  Has the patient contacted their pharmacy? Yes.   Pharmacy told the pt that the medication was denied.  Preferred Pharmacy (with phone number or street name):  [place], [place] - [place]  [address] [place] [address]  Phone:  [phone]  Fax:  [phone]

## 2022-05-16 ENCOUNTER — Other Ambulatory Visit: Payer: Self-pay | Admitting: Family Medicine

## 2022-05-16 ENCOUNTER — Other Ambulatory Visit: Payer: Self-pay | Admitting: Surgical

## 2022-05-21 ENCOUNTER — Other Ambulatory Visit: Payer: Self-pay

## 2022-05-21 ENCOUNTER — Telehealth: Payer: Self-pay | Admitting: Family Medicine

## 2022-05-21 MED ORDER — ZYRTEC-D ALLERGY & SINUS 5-120 MG PO TB12: ORAL_TABLET | ORAL | 0 refills | Status: DC

## 2022-05-21 NOTE — Telephone Encounter (Signed)
Medication was sent

## 2022-05-21 NOTE — Telephone Encounter (Signed)
Pt stated he requested this thorough his pharmacy and has not heard back from [place], please advise.  Medication: ZYRTEC-D ALLERGY & SINUS 5-120 MG tablet  Has the patient contacted their pharmacy? Yes.     Preferred Pharmacy:  [place], [place] - [address], [place] [address] Phone: [phone]  Fax: [phone]

## 2022-06-20 ENCOUNTER — Ambulatory Visit (INDEPENDENT_AMBULATORY_CARE_PROVIDER_SITE_OTHER): Payer: BC Managed Care – PPO | Admitting: Medical

## 2022-06-20 ENCOUNTER — Encounter: Payer: Self-pay | Admitting: Medical

## 2022-06-20 ENCOUNTER — Telehealth: Payer: Self-pay | Admitting: Family Medicine

## 2022-06-20 VITALS — BP 129/79 | HR 86 | Temp 97.7°F | Resp 18 | Ht 65.0 in | Wt 141.4 lb

## 2022-06-20 DIAGNOSIS — R5383 Other fatigue: Secondary | ICD-10-CM

## 2022-06-20 DIAGNOSIS — Z125 Encounter for screening for malignant neoplasm of prostate: Secondary | ICD-10-CM | POA: Diagnosis not present

## 2022-06-20 DIAGNOSIS — R634 Abnormal weight loss: Secondary | ICD-10-CM | POA: Diagnosis not present

## 2022-06-20 DIAGNOSIS — R252 Cramp and spasm: Secondary | ICD-10-CM

## 2022-06-20 DIAGNOSIS — Z0000 Encounter for general adult medical examination without abnormal findings: Secondary | ICD-10-CM

## 2022-06-20 LAB — COMPREHENSIVE METABOLIC PANEL
ALT: 17 U/L (ref 0–53)
AST: 20 U/L (ref 0–37)
Albumin: 4.4 g/dL (ref 3.5–5.2)
Alkaline Phosphatase: 53 U/L (ref 39–117)
BUN: 14 mg/dL (ref 6–23)
CO2: 32 mEq/L (ref 19–32)
Calcium: 9.3 mg/dL (ref 8.4–10.5)
Chloride: 103 mEq/L (ref 96–112)
Creatinine, Ser: 0.87 mg/dL (ref 0.40–1.50)
GFR: 93.06 mL/min (ref >60.00)
Glucose, Bld: 91 mg/dL (ref 70–99)
Potassium: 4.6 mEq/L (ref 3.5–5.1)
Sodium: 139 mEq/L (ref 135–145)
Total Bilirubin: 0.7 mg/dL (ref 0.2–1.2)
Total Protein: 6.9 g/dL (ref 6.0–8.3)

## 2022-06-20 LAB — CBC WITH DIFFERENTIAL/PLATELET
Basophils Absolute: 0 10*3/uL (ref 0.0–0.1)
Basophils Relative: 0.6 % (ref 0.0–3.0)
Eosinophils Absolute: 0.2 10*3/uL (ref 0.0–0.7)
Eosinophils Relative: 3.2 % (ref 0.0–5.0)
HCT: 46.1 % (ref 39.0–52.0)
Hemoglobin: 15.6 g/dL (ref 13.0–17.0)
Lymphocytes Relative: 47.7 % — ABNORMAL HIGH (ref 12.0–46.0)
Lymphs Abs: 2.8 10*3/uL (ref 0.7–4.0)
MCHC: 33.9 g/dL (ref 30.0–36.0)
MCV: 90.4 fl (ref 78.0–100.0)
Monocytes Absolute: 0.3 10*3/uL (ref 0.1–1.0)
Monocytes Relative: 5.5 % (ref 3.0–12.0)
Neutro Abs: 2.5 10*3/uL (ref 1.4–7.7)
Neutrophils Relative %: 43 % (ref 43.0–77.0)
Platelets: 237 10*3/uL (ref 150.0–400.0)
RBC: 5.09 Mil/uL (ref 4.22–5.81)
RDW: 12.2 % (ref 11.5–15.5)
WBC: 5.9 10*3/uL (ref 4.0–10.5)

## 2022-06-20 LAB — MAGNESIUM: Magnesium: 2.1 mg/dL (ref 1.5–2.5)

## 2022-06-20 LAB — VITAMIN B12: Vitamin B-12: 546 pg/mL (ref 211–911)

## 2022-06-20 LAB — TSH: TSH: 1.26 u[IU]/mL (ref 0.35–5.50)

## 2022-06-20 LAB — LIPID PANEL
Cholesterol: 189 mg/dL (ref 0–200)
HDL: 54 mg/dL (ref >39.00)
LDL Cholesterol: 121 mg/dL — ABNORMAL HIGH (ref 0–99)
NonHDL: 134.8
Total CHOL/HDL Ratio: 3
Triglycerides: 68 mg/dL (ref 0.0–149.0)
VLDL: 13.6 mg/dL (ref 0.0–40.0)

## 2022-06-20 LAB — PSA: PSA: 0.01 ng/mL — ABNORMAL LOW (ref 0.10–4.00)

## 2022-06-20 LAB — T4, FREE: Free T4: 1.04 ng/dL (ref 0.60–1.60)

## 2022-06-20 MED ORDER — TADALAFIL 5 MG PO TABS
5.0000 mg | ORAL_TABLET | Freq: Every day | ORAL | 0 refills | Status: DC | PRN
Start: 2022-06-20 — End: 2022-09-03

## 2022-06-20 MED ORDER — FLUTICASONE PROPIONATE 50 MCG/ACT NA SUSP: 2.0000 | Freq: Every day | NASAL | 3 refills | Status: AC

## 2022-06-20 MED ORDER — CETIRIZINE HCL 5 MG PO TABS: 5.0000 mg | ORAL_TABLET | Freq: Every day | ORAL | 3 refills | Status: DC

## 2022-06-20 NOTE — Patient Instructions (Addendum)
For you wellness exam today I have ordered cbc, cmp and lipid panel.  Screening psa today.  Up to date on vaccines. Regarding covid pt is still on process of deciding.   Recommend exercise and healthy diet.  We will let you know lab results as they come in.  Follow up date appointment will be determined after lab review.    For ED refilled your cialis.  For lower ext cramps- will get follow k level and mag level.  For mild fatigue get tsh and t4. Also get b12.  Your weight loss is slow and graudaul. High % loss of brief period despite eating adequate concern. Will get tsh, t4 and advise you contact [facility] today to get scheduled. Also follow your psa. Ask you increase calories and try to work out more. If weight loss continue expand work up.  For allergic rhinitis- refilled your zyrtec but with out sudafed. Recommend you supplamet with nasal spray/steroid if needed for decongestant effect. I sent in flonase but not to fill yet as want to know what nasal spray you have at home first. Then will decide if I think flonase better.  Preventive Care 20-46 Years Old, Male Preventive care refers to lifestyle choices and visits with your health care provider that can promote health and wellness. Preventive care visits are also called wellness exams. What can I expect for my preventive care visit? Counseling During your preventive care visit, your health care provider may ask about your: Medical history, including: Past medical problems. Family medical history. Current health, including: Emotional well-being. Home life and relationship well-being. Sexual activity. Lifestyle, including: Alcohol, nicotine or tobacco, and drug use. Access to firearms. Diet, exercise, and sleep habits. Safety issues such as seatbelt and bike helmet use. Sunscreen use. Work and work Statistician. Physical exam Your health care provider will check your: Height and weight. These may be used to calculate  your BMI (body mass index). BMI is a measurement that tells if you are at a healthy weight. Waist circumference. This measures the distance around your waistline. This measurement also tells if you are at a healthy weight and may help predict your risk of certain diseases, such as type 2 diabetes and high blood pressure. Heart rate and blood pressure. Body temperature. Skin for abnormal spots. What immunizations do I need?  Vaccines are usually given at various ages, according to a schedule. Your health care provider will recommend vaccines for you based on your age, medical history, and lifestyle or other factors, such as travel or where you work. What tests do I need? Screening Your health care provider may recommend screening tests for certain conditions. This may include: Lipid and cholesterol levels. Diabetes screening. This is done by checking your blood sugar (glucose) after you have not eaten for a while (fasting). Hepatitis B test. Hepatitis C test. HIV (human immunodeficiency virus) test. STI (sexually transmitted infection) testing, if you are at risk. Lung cancer screening. Prostate cancer screening. Colorectal cancer screening. Talk with your health care provider about your test results, treatment options, and if necessary, the need for more tests. Follow these instructions at home: Eating and drinking  Eat a diet that includes fresh fruits and vegetables, whole grains, lean protein, and low-fat dairy products. Take vitamin and mineral supplements as recommended by your health care provider. Do not drink alcohol if your health care provider tells you not to drink. If you drink alcohol: Limit how much you have to 0-2 drinks a day. Know how  much alcohol is in your drink. In the [place] one drink equals one 12 oz bottle of beer (355 mL), one 5 oz glass of wine (148 mL), or one 1 oz glass of hard liquor (44 mL). Lifestyle Brush your teeth every morning and night with fluoride  toothpaste. Floss one time each day. Exercise for at least 30 minutes 5 or more days each week. Do not use any products that contain nicotine or tobacco. These products include cigarettes, chewing tobacco, and vaping devices, such as e-cigarettes. If you need help quitting, ask your health care provider. Do not use drugs. If you are sexually active, practice safe sex. Use a condom or other form of protection to prevent STIs. Take aspirin only as told by your health care provider. Make sure that you understand how much to take and what form to take. Work with your health care provider to find out whether it is safe and beneficial for you to take aspirin daily. Find healthy ways to manage stress, such as: Meditation, yoga, or listening to music. Journaling. Talking to a trusted person. Spending time with friends and family. Minimize exposure to UV radiation to reduce your risk of skin cancer. Safety Always wear your seat belt while driving or riding in a vehicle. Do not drive: If you have been drinking alcohol. Do not ride with someone who has been drinking. When you are tired or distracted. While texting. If you have been using any mind-altering substances or drugs. Wear a helmet and other protective equipment during sports activities. If you have firearms in your house, make sure you follow all gun safety procedures. What's next? Go to your health care provider once a year for an annual wellness visit. Ask your health care provider how often you should have your eyes and teeth checked. Stay up to date on all vaccines. This information is not intended to replace advice given to you by your health care provider. Make sure you discuss any questions you have with your health care provider. Document Revised: 01/16/2021 Document Reviewed: 01/16/2021 Elsevier Patient Education  [place].

## 2022-06-20 NOTE — Telephone Encounter (Signed)
Patient would like to transfer care from [facility] to [facility] due to availability. Is this ok?

## 2022-06-20 NOTE — Addendum Note (Signed)
Addended by: Anabel Halon on: 06/20/2022 08:53 AM   Modules accepted: Orders

## 2022-06-20 NOTE — Progress Notes (Addendum)
Subjective:    Patient ID: John Lane, male    DOB: May 12, 1961, 61 y.o.   MRN: [id number]  HPI Pt in for cpe/wellness exam.   Pt is fasting.        Pt states he has been exercising little bit. Doing some push ups and abdomen exercise. Pt states he walks a lot at work. He does not know how many steps he gets on average. Pt states moderate healthy diet. Avoid fried foods. Rare/random occasional beer. 2 cups of coffee a day. Non smoker.  Prostate surgery 8 years ago. States 2 years since seen his urologist. He request to have psa today.   Up to date on colonoscopy. On review appears wil need to repeat 07-2022.   Pt thinks gradual wt loss. On review flow sheet that has been the case. Pt thnks not eating less.   Also some bilateral muscle cramps in calfs. Not every note. Not on walking.     Review of Systems  Constitutional:  Negative for chills, fatigue and fever.  HENT:  Negative for congestion, ear discharge, ear pain, nosebleeds, rhinorrhea and trouble swallowing.        Pt states controlled with zyrtec. He wants refill.  Respiratory:  Negative for cough, chest tightness, shortness of breath and wheezing.   Cardiovascular:  Negative for chest pain and palpitations.  Gastrointestinal:  Negative for abdominal pain, diarrhea, nausea and rectal pain.  Genitourinary:        ED.uses cialis.  Musculoskeletal:  Negative for back pain.  Skin:  Negative for rash.  Neurological:  Negative for dizziness, syncope, weakness and numbness.  Hematological:  Negative for adenopathy. Does not bruise/bleed easily.  Psychiatric/Behavioral:  Negative for behavioral problems, dysphoric mood, self-injury and suicidal ideas. The patient is not nervous/anxious.     Past Medical History:  Diagnosis Date   Allergic state 02/11/2015   Allergy    H/O hematuria    2003   H/O prostate cancer 08/28/2011   Other and unspecified hyperlipidemia 01/18/2014   Prostate cancer ([facility]) 12/24/11   Gleason 3+4=7,  volume 15 gm     Social History   Socioeconomic History   Marital status: Married    Spouse name: Not on file   Number of children: Not on file   Years of education: Not on file   Highest education level: Not on file  Occupational History   Not on file  Tobacco Use   Smoking status: Former    Packs/day: 0.50    Years: 10.00    Total pack years: 5.00    Types: Cigarettes    Quit date: 08/05/1991    Years since quitting: 30.8   Smokeless tobacco: Never  Substance and Sexual Activity   Alcohol use: Yes    Alcohol/week: 2.0 standard drinks of alcohol    Types: 2 Standard drinks or equivalent per week    Comment: 1-2 weekly   Drug use: No   Sexual activity: Yes    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  Other Topics Concern   Not on file  Social History Narrative   Married   Former smoker   Quit over 18 years ago-smoked for 10 years 1/2 ppd   3 children   Social Determinants of Health   Financial Resource Strain: Not on file  Food Insecurity: Not on file  Transportation Needs: Not on file  Physical Activity: Not on file  Stress: Not on file  Social Connections: Not on file  Intimate Partner Violence: Not on file    Past Surgical History:  Procedure Laterality Date   APPENDECTOMY     CIRCUMCISION  05/12/2012   Procedure: CIRCUMCISION ADULT;  Surgeon: Bernestine Amass, MD;  Location: [facility];  Service: Urology;  Laterality: N/A;   ROBOT ASSISTED LAPAROSCOPIC RADICAL PROSTATECTOMY  05/12/2012   Procedure: ROBOTIC ASSISTED LAPAROSCOPIC RADICAL PROSTATECTOMY;  Surgeon: Bernestine Amass, MD;  Location: [facility];  Service: Urology;  Laterality: N/A;   TONSILLECTOMY      Family History  Problem Relation Age of Onset   Heart disease Brother        MI s/p stent   Diabetes Father    Hypertension Father    Heart disease Father    Arthritis Father    Hypertension Mother    Stroke Mother 72       left arm weakness, speech  improving,    Gout Mother    Arthritis Sister    Arthritis Sister    Cancer Sister    Hypertension Brother    Hearing loss Brother    Hearing loss Son    Heart disease Brother        MI   Hypertension Brother    Colon cancer Neg Hx    Esophageal cancer Neg Hx    Rectal cancer Neg Hx    Stomach cancer Neg Hx     No Known Allergies  Current Outpatient Medications on File Prior to Visit  Medication Sig Dispense Refill   acetaminophen (TYLENOL) 500 MG tablet Take 1 tablet (500 mg total) by mouth 3 (three) times daily as needed. 90 tablet 5   atorvastatin (LIPITOR) 10 MG tablet Take 1 tablet (10 mg total) by mouth daily. 30 tablet 3   BAYER ASPIRIN EC LOW DOSE 81 MG EC tablet TAKE ONE TABLET BY MOUTH ONCE DAILY AS NEEDED FOR PAIN 400 tablet 0   benzonatate (TESSALON) 100 MG capsule Take 1 capsule (100 mg total) by mouth 3 (three) times daily as needed. 30 capsule 0   bismuth subsalicylate (PEPTO BISMOL) 262 MG/15ML suspension Take 30 mLs by mouth every 6 (six) hours as needed. 360 mL 3   budesonide (RHINOCORT AQUA) 32 MCG/ACT nasal spray Place 1 spray into both nostrils daily. 8.6 g 5   Camphor-Menthol-Methyl Sal 1.2-5.7-6.3 % PTCH Apply 1 patch topically 2 (two) times daily. 40 patch 1   Cholecalciferol (VITAMIN D PO) Take by mouth.     COVID-19 mRNA bivalent vaccine, Pfizer, (PFIZER COVID-19 VAC BIVALENT) injection Inject into the muscle. 0.3 mL 0   cyclobenzaprine (FLEXERIL) 10 MG tablet Take 1 tablet (10 mg total) by mouth at bedtime. 10 tablet 0   FLOVENT HFA 110 MCG/ACT inhaler INHALE 2 PUFFS TWICE DAILY 36 Inhaler 1   influenza vac split quadrivalent PF (FLUARIX) 0.5 ML injection Inject into the muscle. 0.5 mL 0   Krill Oil 500 MG CAPS Take 1 capsule (500 mg total) by mouth daily. 160 capsule 6   meloxicam (MOBIC) 15 MG tablet TAKE 1 TABLET BY MOUTH ONCE DAILY AS NEEDED 30 tablet 0   Multiple Vitamins-Minerals (AIRBORNE GUMMIES PO)      Multiple Vitamins-Minerals (GNP MEGA MULTI  FOR MEN) TABS [employer] Mega Men Performance & Vitality 30 packs. Take as directed 30 tablet 1   olopatadine (PATANOL) 0.1 % ophthalmic solution INSTILL 1 DROP IN EACH EYE TWICE DAILY AS DIRECTED 15 mL 1   predniSONE (STERAPRED UNI-PAK 21 TAB)  10 MG (21) TBPK tablet Take as directed on package 21 tablet 0   Saline (SIMPLY SALINE) 0.9 % AERS Place 1 spray into the nose daily. 378 mL 2   traMADol (ULTRAM) 50 MG tablet Take 1 tablet (50 mg total) by mouth every 8 (eight) hours as needed. 15 tablet 0   Turmeric Curcumin 500 MG CAPS Take 500 mg by mouth daily. 250 capsule 1   No current facility-administered medications on file prior to visit.    BP 129/79   Pulse 86   Temp 97.7 F (36.5 C) (Oral)   Resp 18   Ht '5\' 5"'$  (1.651 m)   Wt 141 lb 6.4 oz (64.1 kg)   SpO2 95%   BMI 23.53 kg/m        Objective:   Physical Exam  General Mental Status- Alert. General Appearance- Not in acute distress.   Skin General: Color- Normal Color. Moisture- Normal Moisture.  Neck Carotid Arteries- Normal color. Moisture- Normal Moisture. No carotid bruits. No JVD.  Chest and Lung Exam Auscultation: Breath Sounds:-Normal.  Cardiovascular Auscultation:Rythm- Regular. Murmurs & Other Heart Sounds:Auscultation of the heart reveals- No Murmurs.  Abdomen Inspection:-Inspeection Normal. Palpation/Percussion:Note:No mass. Palpation and Percussion of the abdomen reveal- Non Tender, Non Distended + BS, no rebound or guarding.  Neurologic Cranial Nerve exam:- CN III-XII intact(No nystagmus), symmetric smile. Strength:- 5/5 equal and symmetric strength both upper and lower extremities.    Lower ext- pedal pulse intact. Good flow.     Assessment & Plan:   Patient Instructions  For you wellness exam today I have ordered cbc, cmp and lipid panel.  Screening psa today.  Up to date on vaccines.  Recommend exercise and healthy diet.  We will let you know lab results as they come in.  Follow up date  appointment will be determined after lab review.      Your weight loss is slow and graudaul. High % loss of brief period despite eating adequate concern. Will get tsh, t4 and advise you contact [facility] today to get scheduled. Also follow your psa. Ask you increase calories and try to work out more. If weight loss continue expand work up.  For lower ext cramps- will follow k level and mag level.  For mild fatigue get tsh and t4. Also get b12.   Mackie Pai, [place]     [id number] charge in addition to wellness exam. Currently seeing pt yearly and addressed chronic medical issues today while in. ED, weight loss slight/gradual over the years and allergic rhinitis. Refilled meds today. Counseled moderate length on how we would approach gradual weight loss as explained above.

## 2022-06-21 MED ORDER — ATORVASTATIN CALCIUM 10 MG PO TABS: 10.0000 mg | ORAL_TABLET | Freq: Every day | ORAL | 3 refills | Status: DC

## 2022-06-21 NOTE — Addendum Note (Signed)
Addended by: Anabel Halon on: 06/21/2022 01:57 PM   Modules accepted: Orders

## 2022-07-02 ENCOUNTER — Encounter: Payer: Self-pay | Admitting: Gastroenterology

## 2022-07-08 NOTE — Telephone Encounter (Signed)
LVM to schedule physical for next year with Percell Miller.

## 2022-08-28 ENCOUNTER — Telehealth: Payer: Self-pay | Admitting: Medical

## 2022-08-28 ENCOUNTER — Telehealth: Payer: Self-pay | Admitting: Orthopedic Surgery

## 2022-08-28 MED ORDER — LIDOCAINE 4 % EX PTCH: 1.0000 | MEDICATED_PATCH | CUTANEOUS | 0 refills | Status: AC

## 2022-08-28 NOTE — Telephone Encounter (Signed)
There are lidocaine patches that are available over-the-counter, he can try those.  If those do not help, I recommend he come back to see me or Dr. Marlou Sa since he has not been seen since 2022

## 2022-08-28 NOTE — Telephone Encounter (Signed)
Pt wanted to know if pcp would call in a lidocaine patch to use on his knee. Stated they talked about his knee pain a couple weeks ago when he came in for a cpe. He used his sister's patch and it seemed to help. Please advise.   [place], [place] - [place] [address] [place] [address] Phone: [phone]  Fax: [phone]

## 2022-08-28 NOTE — Telephone Encounter (Signed)
Pt called appt made , stated he would get in contact with [facility] as well since he seen them in the past

## 2022-08-28 NOTE — Telephone Encounter (Signed)
I called patient and advised. He requested morning appointment for bilateral knees. Appt made for Wed 09/10/2022 at 9am.

## 2022-08-28 NOTE — Telephone Encounter (Signed)
Received call from patient needing a Rx for Lidocaine patches.  Patient said he is having knee soreness.  The number to contact patient is [phone]

## 2022-08-28 NOTE — Addendum Note (Signed)
Addended by: Anabel Halon on: 08/28/2022 11:47 AM   Modules accepted: Orders

## 2022-09-02 ENCOUNTER — Ambulatory Visit: Payer: BC Managed Care – PPO | Admitting: Medical

## 2022-09-03 ENCOUNTER — Other Ambulatory Visit: Payer: Self-pay | Admitting: Medical

## 2022-09-10 ENCOUNTER — Ambulatory Visit: Payer: BC Managed Care – PPO | Admitting: Orthopedic Surgery

## 2022-09-30 ENCOUNTER — Telehealth: Payer: Self-pay | Admitting: Family Medicine

## 2022-10-03 MED ORDER — CETIRIZINE HCL 5 MG PO TABS: 5.0000 mg | ORAL_TABLET | Freq: Every day | ORAL | 3 refills | Status: DC

## 2022-10-03 MED ORDER — TADALAFIL 5 MG PO TABS: 5.0000 mg | ORAL_TABLET | Freq: Every day | ORAL | 0 refills | Status: DC | PRN

## 2022-10-03 NOTE — Telephone Encounter (Signed)
Pt would like to know why ZYRTEC-D ALLERGY & SINUS 5-120 MG tablet was denied. And if this can be called in. Also requesting tadalafil (CIALIS) 5 MG tablet.   [place], [place] - [place] [address] [place] [address] Phone: [phone]  Fax: [phone]

## 2022-10-03 NOTE — Telephone Encounter (Signed)
Rx sent 

## 2022-10-03 NOTE — Addendum Note (Signed)
Addended by: Jeronimo Greaves on: 10/03/2022 10:52 AM   Modules accepted: Orders

## 2022-10-06 ENCOUNTER — Telehealth: Payer: Self-pay | Admitting: *Deleted

## 2022-10-06 ENCOUNTER — Other Ambulatory Visit: Payer: Self-pay | Admitting: *Deleted

## 2022-10-06 ENCOUNTER — Other Ambulatory Visit: Payer: Self-pay | Admitting: Family Medicine

## 2022-10-06 MED ORDER — ZYRTEC-D ALLERGY & SINUS 5-120 MG PO TB12: ORAL_TABLET | ORAL | 1 refills | Status: DC

## 2022-10-06 NOTE — Telephone Encounter (Signed)
Left detail message on machine that we will call pharmacy when it opens due to rx was sent in on 10/03/22.

## 2022-10-06 NOTE — Telephone Encounter (Signed)
Who Is Calling Patient / Member / Family / Caregiver Call Type Triage / Clinical Relationship To Patient Self Return Phone Number [phone] (Primary) Chief Complaint Prescription Refill or Medication Request (non symptomatic) Reason for Call Medication Question / Request Initial Comment Caller states he needs to check on his refill and have it called in if it has not been yet. Translation No Nurse Assessment Nurse: Roselyn Reef, RN, Brayton Layman Date/Time (Eastern Time): 10/03/2022 6:48:13 PM Please select the assessment type ---Refill Additional Documentation ---Caller states he is needing a refill zyrtec d 120 mg Does the patient have enough medication to last until the office opens? ---Unable to obtain loaner dose from Pharmacy Does the client directives allow for assistance with medications after hours? ---Yes Was the medication filled within the last 6 months? ---Yes What is the name of the medication, dose and instructions as listed on the bottle? ---Zyrtec D 120 mg daily Name of the physician as listed on the bottle. ---[facility] name and phone number where most recently filled. ---[facility] [phone]  Comments User: Cristi Loron, RN Date/Time Eilene Ghazi Time): 10/03/2022 6:58:19 PM spoke with pharmacist and verified medication. Pharmacist states that he would need providers NPI or DEA number to send in medication. Patient informed that nurse does not have access to either of those and cannot refill medication without that after hours. Should call the office Monday morning in regards to medication refill

## 2022-10-06 NOTE — Telephone Encounter (Signed)
Pt called back and he needed Zyrtec-D.  He usually takes this in the spring and fall.  Rx sent in.

## 2022-11-10 ENCOUNTER — Other Ambulatory Visit: Payer: Self-pay | Admitting: Medical

## 2022-12-09 ENCOUNTER — Other Ambulatory Visit: Payer: Self-pay | Admitting: Medical

## 2023-01-05 ENCOUNTER — Other Ambulatory Visit: Payer: Self-pay | Admitting: Medical

## 2023-04-10 ENCOUNTER — Other Ambulatory Visit: Payer: Self-pay | Admitting: Medical

## 2023-06-22 ENCOUNTER — Ambulatory Visit (INDEPENDENT_AMBULATORY_CARE_PROVIDER_SITE_OTHER): Payer: BC Managed Care – PPO | Admitting: Medical

## 2023-06-22 ENCOUNTER — Encounter: Payer: Self-pay | Admitting: Medical

## 2023-06-22 VITALS — BP 130/80 | HR 68 | Temp 98.0°F | Resp 18 | Ht 65.0 in | Wt 143.0 lb

## 2023-06-22 DIAGNOSIS — Z1211 Encounter for screening for malignant neoplasm of colon: Secondary | ICD-10-CM

## 2023-06-22 DIAGNOSIS — J3089 Other allergic rhinitis: Secondary | ICD-10-CM

## 2023-06-22 DIAGNOSIS — Z13220 Encounter for screening for lipoid disorders: Secondary | ICD-10-CM | POA: Diagnosis not present

## 2023-06-22 DIAGNOSIS — N528 Other male erectile dysfunction: Secondary | ICD-10-CM | POA: Diagnosis not present

## 2023-06-22 DIAGNOSIS — Z0000 Encounter for general adult medical examination without abnormal findings: Secondary | ICD-10-CM

## 2023-06-22 DIAGNOSIS — Z23 Encounter for immunization: Secondary | ICD-10-CM

## 2023-06-22 DIAGNOSIS — G8929 Other chronic pain: Secondary | ICD-10-CM

## 2023-06-22 DIAGNOSIS — Z8546 Personal history of malignant neoplasm of prostate: Secondary | ICD-10-CM

## 2023-06-22 DIAGNOSIS — Z0001 Encounter for general adult medical examination with abnormal findings: Secondary | ICD-10-CM | POA: Diagnosis not present

## 2023-06-22 DIAGNOSIS — M25561 Pain in right knee: Secondary | ICD-10-CM

## 2023-06-22 DIAGNOSIS — M25562 Pain in left knee: Secondary | ICD-10-CM

## 2023-06-22 LAB — CBC WITH DIFFERENTIAL/PLATELET
Basophils Absolute: 0 10*3/uL (ref 0.0–0.1)
Basophils Relative: 0.3 % (ref 0.0–3.0)
Eosinophils Absolute: 0.2 10*3/uL (ref 0.0–0.7)
Eosinophils Relative: 2.8 % (ref 0.0–5.0)
HCT: 45 % (ref 39.0–52.0)
Hemoglobin: 15.4 g/dL (ref 13.0–17.0)
Lymphocytes Relative: 60.7 % — ABNORMAL HIGH (ref 12.0–46.0)
Lymphs Abs: 3.9 10*3/uL (ref 0.7–4.0)
MCHC: 34.2 g/dL (ref 30.0–36.0)
MCV: 90.3 fL (ref 78.0–100.0)
Monocytes Absolute: 0.3 10*3/uL (ref 0.1–1.0)
Monocytes Relative: 4.5 % (ref 3.0–12.0)
Neutro Abs: 2 10*3/uL (ref 1.4–7.7)
Neutrophils Relative %: 31.7 % — ABNORMAL LOW (ref 43.0–77.0)
Platelets: 212 10*3/uL (ref 150.0–400.0)
RBC: 4.99 Mil/uL (ref 4.22–5.81)
RDW: 12.5 % (ref 11.5–15.5)
WBC: 6.4 10*3/uL (ref 4.0–10.5)

## 2023-06-22 LAB — COMPREHENSIVE METABOLIC PANEL
ALT: 19 U/L (ref 0–53)
AST: 20 U/L (ref 0–37)
Albumin: 4.4 g/dL (ref 3.5–5.2)
Alkaline Phosphatase: 53 U/L (ref 39–117)
BUN: 15 mg/dL (ref 6–23)
CO2: 27 meq/L (ref 19–32)
Calcium: 9.3 mg/dL (ref 8.4–10.5)
Chloride: 104 meq/L (ref 96–112)
Creatinine, Ser: 0.8 mg/dL (ref 0.40–1.50)
GFR: 94.77 mL/min (ref >60.00)
Glucose, Bld: 92 mg/dL (ref 70–99)
Potassium: 4.2 meq/L (ref 3.5–5.1)
Sodium: 139 meq/L (ref 135–145)
Total Bilirubin: 0.7 mg/dL (ref 0.2–1.2)
Total Protein: 6.8 g/dL (ref 6.0–8.3)

## 2023-06-22 LAB — LIPID PANEL
Cholesterol: 159 mg/dL (ref 0–200)
HDL: 51.3 mg/dL (ref >39.00)
LDL Cholesterol: 92 mg/dL (ref 0–99)
NonHDL: 107.58
Total CHOL/HDL Ratio: 3
Triglycerides: 78 mg/dL (ref 0.0–149.0)
VLDL: 15.6 mg/dL (ref 0.0–40.0)

## 2023-06-22 LAB — PSA: PSA: 0.03 ng/mL — ABNORMAL LOW (ref 0.10–4.00)

## 2023-06-22 MED ORDER — TADALAFIL 5 MG PO TABS: 5.0000 mg | ORAL_TABLET | Freq: Every day | ORAL | 0 refills | Status: AC | PRN

## 2023-06-22 MED ORDER — CETIRIZINE HCL 5 MG PO TABS: 5.0000 mg | ORAL_TABLET | Freq: Every day | ORAL | 3 refills | Status: AC

## 2023-06-22 MED ORDER — MELOXICAM 7.5 MG PO TABS: 7.5000 mg | ORAL_TABLET | Freq: Every day | ORAL | 0 refills | Status: AC

## 2023-06-22 NOTE — Addendum Note (Signed)
Addended by: Maximino Sarin on: 06/22/2023 09:27 AM   Modules accepted: Orders

## 2023-06-22 NOTE — Addendum Note (Signed)
Addended by: Gwenevere Abbot on: 06/22/2023 08:39 AM   Modules accepted: Orders

## 2023-06-22 NOTE — Progress Notes (Signed)
Subjective:    Patient ID: John Lane, male    DOB: 02/03/1961, 62 y.o.   MRN: [id number]  HPI  Pt states he has been exercising little bit. Doing some push ups and abdomen exercise. Pt states he walks a lot at work. He does not know how many steps he gets on average. Pt states moderate healthy diet. Avoid fried foods. Rare/random occasional beer. 2 cups of coffee a day. Non smoker.   Prostate surgery 8 years ago. States 2 years since seen his urologist. He request to have psa today. Years since he followed up with urologist.   Up to date on colonoscopy. On review appears wil need to repeat 07-2022.  On review pt weight is stable. Since 2021.   High cholesterol- pt is on atrovastatin.   Elevated blood pressure today initially. He states reading at homes fluctuates. He remembers at times time 130/90. Some readings better or worse.       Review of Systems  Constitutional:  Negative for chills, fatigue and fever.  HENT:  Negative for congestion, ear discharge, ear pain, nosebleeds, rhinorrhea and trouble swallowing.        Pt states controlled with zyrtec. He wants refill.  Respiratory:  Negative for cough, chest tightness, shortness of breath and wheezing.   Cardiovascular:  Negative for chest pain and palpitations.  Gastrointestinal:  Negative for abdominal pain, diarrhea, nausea and rectal pain.  Genitourinary:  Negative for dysuria, flank pain and genital sores.       ED.uses cialis occasoinally.  Musculoskeletal:  Negative for back pain.  Skin:  Negative for rash.  Neurological:  Negative for dizziness, syncope, weakness and numbness.  Hematological:  Negative for adenopathy. Does not bruise/bleed easily.  Psychiatric/Behavioral:  Negative for behavioral problems, dysphoric mood, self-injury and suicidal ideas. The patient is not nervous/anxious.      ED- uses cialis on occasion.  Stable allergic rhinitis- continue zyrtec.  Bilateral knee pain. Rt side worse than  left. DJD on 2022 xray.  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     Social History   Socioeconomic History   Marital status: Married    Spouse name: Not on file   Number of children: Not on file   Years of education: Not on file   Highest education level: Not on file  Occupational History   Not on file  Tobacco Use   Smoking status: Former    Current packs/day: 0.00    Average packs/day: 0.5 packs/day for 10.0 years (5.0 ttl pk-yrs)    Types: Cigarettes    Start date: 08/04/1981    Quit date: 08/05/1991    Years since quitting: 31.9   Smokeless tobacco: Never  Substance and Sexual Activity   Alcohol use: Yes    Alcohol/week: 2.0 standard drinks of alcohol    Types: 2 Standard drinks or equivalent per week    Comment: 1-2 weekly   Drug use: No   Sexual activity: Yes    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  Other Topics Concern   Not on file  Social History Narrative   Married   Former smoker   Quit over 18 years ago-smoked for 10 years 1/2  ppd   3 children   Social Determinants of Health   Financial Resource Strain: Not on file  Food Insecurity: Not on file  Transportation Needs: Not on file  Physical Activity: Not on file  Stress: Not on file  Social Connections: Not on file  Intimate Partner Violence: Not on file    Past Surgical History:  Procedure Laterality Date   APPENDECTOMY     CIRCUMCISION  05/12/2012   Procedure: CIRCUMCISION ADULT;  Surgeon: Valetta Fuller, MD;  Location: [facility];  Service: Urology;  Laterality: N/A;   ROBOT ASSISTED LAPAROSCOPIC RADICAL PROSTATECTOMY  05/12/2012   Procedure: ROBOTIC ASSISTED LAPAROSCOPIC RADICAL PROSTATECTOMY;  Surgeon: Valetta Fuller, MD;  Location: [facility];  Service: Urology;  Laterality: N/A;   TONSILLECTOMY      Family History  Problem Relation Age of Onset   Heart disease Brother        MI s/p stent   Diabetes Father    Hypertension Father    Heart disease Father    Arthritis Father    Hypertension Mother    Stroke Mother 1       left arm weakness, speech improving,    Gout Mother    Arthritis Sister    Arthritis Sister    Cancer Sister    Hypertension Brother    Hearing loss Brother    Hearing loss Son    Heart disease Brother        MI   Hypertension Brother    Colon cancer Neg Hx    Esophageal cancer Neg Hx    Rectal cancer Neg Hx    Stomach cancer Neg Hx     No Known Allergies  Current Outpatient Medications on File Prior to Visit  Medication Sig Dispense Refill   acetaminophen (TYLENOL) 500 MG tablet Take 1 tablet (500 mg total) by mouth 3 (three) times daily as needed. 90 tablet 5   atorvastatin (LIPITOR) 10 MG tablet Take 1 tablet (10 mg total) by mouth daily. 90 tablet 3   BAYER ASPIRIN EC LOW DOSE 81 MG EC tablet TAKE ONE TABLET BY MOUTH ONCE DAILY AS NEEDED FOR PAIN 400 tablet 0   cetirizine (ZYRTEC) 5 MG tablet Take 1 tablet (5 mg total) by mouth daily. 30 tablet 3   Cholecalciferol (VITAMIN D PO) Take by mouth.     fluticasone (FLONASE) 50 MCG/ACT nasal spray Place 2 sprays into both nostrils daily. 48 g 3   lidocaine 4 % Place 1 patch onto the skin daily. 7 patch 0   Multiple Vitamins-Minerals (AIRBORNE GUMMIES PO)      Multiple Vitamins-Minerals (GNP MEGA MULTI FOR MEN) TABS [employer] Mega Men Performance & Vitality 30 packs. Take as directed 30 tablet 1   tadalafil (CIALIS) 5 MG tablet TAKE 1 TABLET BY MOUTH ONCE DAILY AS NEEDED FOR ERECTILE DYSFUNCTION 30 tablet 0   Turmeric Curcumin 500 MG CAPS Take 500 mg by mouth daily. 250 capsule 1   No current facility-administered medications on file prior to visit.    BP 130/80   Pulse 68   Temp 98 F (36.7 C)   Resp 18   Ht 5\' 5"  (1.651 m)   Wt 143 lb  (64.9 kg)   SpO2 98%   BMI 23.80 kg/m         Objective:   Physical Exam  General Mental Status- Alert. General Appearance- Not in acute distress.   Skin General: Color- Normal Color. Moisture- Normal Moisture.  Neck Carotid Arteries- Normal color. Moisture- Normal Moisture. No carotid bruits. No JVD.  Chest and Lung Exam Auscultation: Breath Sounds:-Normal.  Cardiovascular Auscultation:Rythm- Regular. Murmurs & Other Heart Sounds:Auscultation of the heart reveals- No Murmurs.  Abdomen Inspection:-Inspeection Normal. Palpation/Percussion:Note:No mass. Palpation and Percussion of the abdomen reveal- Non Tender, Non Distended + BS, no rebound or guarding.   Neurologic Cranial Nerve exam:- CN III-XII intact(No nystagmus), symmetric smile. Strength:- 5/5 equal and symmetric strength both upper and lower extremities.       Assessment & Plan:  For you wellness exam today I have ordered cbc, cmp and lipid panel.  Psa recheck due to hx of prostate CA. I  Place referral to GI MD so you can get repeat colonoscopy. Please call them directly as well.  Vaccine given today. Flu vaccine today. Asked pt to get date of second shingrix vaccine.  Recommend exercise and healthy diet.  We will let you know lab results as they come in.  Follow up date appointment will be determined after lab review.    Allergic rhintiis stable- continue zyrtec for allergies.  High cholesterol- follow lab and may adjust treatment. Presenlty continue atorvastatin.  Elevated bp- better on recheck. Check bp at home 2-3 times a week. Update me by my chart on readings in 2 weeks.  ED- rx refill of cialis.  Bilateral knee pain- rx melocam. Take as needed but check bp before using. If bp over 140/90 not ot use. Will refer back to your orthopedist.  Follow up date to be determined after lab review and bp update.    Esperanza Richters, [place]   [id number] charge as combined wellness and addressed chronic  med problems as well as gave refills and place referral.

## 2023-06-22 NOTE — Addendum Note (Signed)
Addended by: Gwenevere Abbot on: 06/22/2023 08:26 PM   Modules accepted: Orders

## 2023-06-22 NOTE — Patient Instructions (Addendum)
For you wellness exam today I have ordered cbc, cmp and lipid panel.  Psa recheck due to hx of prostate CA. I  Place referral to GI MD so you can get repeat colonoscopy. Please call them directly as well.  Vaccine given today. Flu vaccine today. Asked pt to get date of second shingrix vaccine.  Recommend exercise and healthy diet.  We will let you know lab results as they come in.  Follow up date appointment will be determined after lab review.    Allergic rhintiis stable- continue zyrtec for allergies.  High cholesterol- follow lab and may adjust treatment. Presenlty continue atorvastatin.  Elevated bp- better on recheck. Check bp at home 2-3 times a week. Update me by my chart on readings in 2 weeks.  ED- rx refill of cialis.  Bilateral knee pain- rx meloxicam. Take as needed but check bp before using. If bp over 140/90 not to use. Will refer back to your orthopedist.  Follow up date to be determined after lab review and bp update.  Preventive Care 75-12 Years Old, Male Preventive care refers to lifestyle choices and visits with your health care provider that can promote health and wellness. Preventive care visits are also called wellness exams. What can I expect for my preventive care visit? Counseling During your preventive care visit, your health care provider may ask about your: Medical history, including: Past medical problems. Family medical history. Current health, including: Emotional well-being. Home life and relationship well-being. Sexual activity. Lifestyle, including: Alcohol, nicotine or tobacco, and drug use. Access to firearms. Diet, exercise, and sleep habits. Safety issues such as seatbelt and bike helmet use. Sunscreen use. Work and work Astronomer. Physical exam Your health care provider will check your: Height and weight. These may be used to calculate your BMI (body mass index). BMI is a measurement that tells if you are at a healthy  weight. Waist circumference. This measures the distance around your waistline. This measurement also tells if you are at a healthy weight and may help predict your risk of certain diseases, such as type 2 diabetes and high blood pressure. Heart rate and blood pressure. Body temperature. Skin for abnormal spots. What immunizations do I need?  Vaccines are usually given at various ages, according to a schedule. Your health care provider will recommend vaccines for you based on your age, medical history, and lifestyle or other factors, such as travel or where you work. What tests do I need? Screening Your health care provider may recommend screening tests for certain conditions. This may include: Lipid and cholesterol levels. Diabetes screening. This is done by checking your blood sugar (glucose) after you have not eaten for a while (fasting). Hepatitis B test. Hepatitis C test. HIV (human immunodeficiency virus) test. STI (sexually transmitted infection) testing, if you are at risk. Lung cancer screening. Prostate cancer screening. Colorectal cancer screening. Talk with your health care provider about your test results, treatment options, and if necessary, the need for more tests. Follow these instructions at home: Eating and drinking  Eat a diet that includes fresh fruits and vegetables, whole grains, lean protein, and low-fat dairy products. Take vitamin and mineral supplements as recommended by your health care provider. Do not drink alcohol if your health care provider tells you not to drink. If you drink alcohol: Limit how much you have to 0-2 drinks a day. Know how much alcohol is in your drink. In the [place] one drink equals one 12 oz bottle of beer (355 mL),  one 5 oz glass of wine (148 mL), or one 1 oz glass of hard liquor (44 mL). Lifestyle Brush your teeth every morning and night with fluoride toothpaste. Floss one time each day. Exercise for at least 30 minutes 5 or more days  each week. Do not use any products that contain nicotine or tobacco. These products include cigarettes, chewing tobacco, and vaping devices, such as e-cigarettes. If you need help quitting, ask your health care provider. Do not use drugs. If you are sexually active, practice safe sex. Use a condom or other form of protection to prevent STIs. Take aspirin only as told by your health care provider. Make sure that you understand how much to take and what form to take. Work with your health care provider to find out whether it is safe and beneficial for you to take aspirin daily. Find healthy ways to manage stress, such as: Meditation, yoga, or listening to music. Journaling. Talking to a trusted person. Spending time with friends and family. Minimize exposure to UV radiation to reduce your risk of skin cancer. Safety Always wear your seat belt while driving or riding in a vehicle. Do not drive: If you have been drinking alcohol. Do not ride with someone who has been drinking. When you are tired or distracted. While texting. If you have been using any mind-altering substances or drugs. Wear a helmet and other protective equipment during sports activities. If you have firearms in your house, make sure you follow all gun safety procedures. What's next? Go to your health care provider once a year for an annual wellness visit. Ask your health care provider how often you should have your eyes and teeth checked. Stay up to date on all vaccines. This information is not intended to replace advice given to you by your health care provider. Make sure you discuss any questions you have with your health care provider. Document Revised: 01/16/2021 Document Reviewed: 01/16/2021 Elsevier Patient Education  2024 [employer].

## 2023-06-22 NOTE — Addendum Note (Signed)
Addended by: Gwenevere Abbot on: 06/22/2023 08:54 AM   Modules accepted: Orders

## 2023-07-09 ENCOUNTER — Other Ambulatory Visit (INDEPENDENT_AMBULATORY_CARE_PROVIDER_SITE_OTHER): Payer: BC Managed Care – PPO

## 2023-07-09 ENCOUNTER — Ambulatory Visit (INDEPENDENT_AMBULATORY_CARE_PROVIDER_SITE_OTHER): Payer: BC Managed Care – PPO | Admitting: Orthopedic Surgery

## 2023-07-09 DIAGNOSIS — M1712 Unilateral primary osteoarthritis, left knee: Secondary | ICD-10-CM

## 2023-07-09 DIAGNOSIS — M25561 Pain in right knee: Secondary | ICD-10-CM

## 2023-07-09 DIAGNOSIS — M25562 Pain in left knee: Secondary | ICD-10-CM

## 2023-07-09 DIAGNOSIS — N170 Acute kidney failure with tubular necrosis: Secondary | ICD-10-CM | POA: Diagnosis not present

## 2023-07-09 DIAGNOSIS — M1711 Unilateral primary osteoarthritis, right knee: Secondary | ICD-10-CM

## 2023-07-10 ENCOUNTER — Encounter: Payer: Self-pay | Admitting: Orthopedic Surgery

## 2023-07-10 ENCOUNTER — Telehealth: Payer: Self-pay | Admitting: Radiology

## 2023-07-10 DIAGNOSIS — N170 Acute kidney failure with tubular necrosis: Secondary | ICD-10-CM | POA: Diagnosis not present

## 2023-07-10 MED ORDER — METHYLPREDNISOLONE ACETATE 40 MG/ML IJ SUSP
40.0000 mg | INTRAMUSCULAR | Status: AC | PRN
  Administered 2023-07-10: 40 mg via INTRA_ARTICULAR

## 2023-07-10 MED ORDER — BUPIVACAINE HCL 0.25 % IJ SOLN
4.0000 mL | INTRAMUSCULAR | Status: AC | PRN
  Administered 2023-07-10: 4 mL via INTRA_ARTICULAR

## 2023-07-10 MED ORDER — LIDOCAINE HCL 1 % IJ SOLN
5.0000 mL | INTRAMUSCULAR | Status: AC | PRN
  Administered 2023-07-10: 5 mL

## 2023-07-10 NOTE — Telephone Encounter (Signed)
Please obtain piror authorization for bilat knee gel injections-Luke Thanks.

## 2023-07-10 NOTE — Progress Notes (Signed)
Office Visit Note   Patient: John Lane           Date of Birth: [birth year]           MRN: [id number] Visit Date: 07/09/2023 Requested by: Esperanza Richters, PA-C [address] [place],  [place] [address] PCP: Marisue Brooklyn  Subjective: Chief Complaint  Patient presents with   Right Knee - Pain   Left Knee - Pain    HPI: John Lane is a 62 y.o. male who presents to the office reporting bilateral knee pain.  He has history of bilateral knee osteoarthritis.  Describes pain in both the right and left knees equally.  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  Pain will wake him up at night.  Denies any new mechanical symptoms.  Was last seen in 2022 with knee injection at that time that gave him some relief but he does not feel that this was extremely helpful.  Denies any history of diabetes, blood thinner use, cardiac disease.  No history of prior surgery to the knees.  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  Also does some yard work.  Takes Tylenol arthritis at times..                ROS: All systems reviewed are negative as they relate to the chief complaint within the history of present illness.  Patient denies fevers or chills.  Assessment & Plan: Visit Diagnoses:  1. Unilateral primary osteoarthritis, right knee   2. Pain in both knees, unspecified chronicity   3. Unilateral primary osteoarthritis, left knee     Plan: Patient is a 62 year old male who presents for evaluation of bilateral knee pain.  Has history of bilateral knee osteoarthritis that radiographically is worse in the right knee but he has equal symptoms in either knee.  He had cortisone injection in the past  which gave him some relief but not for very long time.  We discussed options available to patient and he is considering knee replacement surgery which a family member has had with good success but he is interested in trying gel injections which another family member has also tried with good relief.  We will plan to preapproved him for bilateral knee gel injections and he would like to try bilateral knee cortisone injections today for at least some temporary relief as we await the gel.  Right knee was aspirated of 13 cc of nonpurulent synovial fluid and cortisone injections administered into each knee.  Tolerated procedure well.  Follow-up after gel injection approval.  This patient is diagnosed with osteoarthritis of the knee(s).    Radiographs show evidence of joint space narrowing, osteophytes, subchondral sclerosis and/or subchondral cysts.  This patient has knee pain which interferes with functional and activities of daily living.    This patient has experienced inadequate response, adverse effects and/or intolerance with conservative treatments such as acetaminophen, NSAIDS, topical creams, physical therapy or regular exercise, knee bracing and/or weight loss.   This patient has experienced inadequate response or has a contraindication to intra articular steroid injections for at least 3 months.   This patient is not scheduled to have a  total knee replacement within 6 months of starting treatment with viscosupplementation.   Follow-Up Instructions: No follow-ups on file.   Orders:  Orders Placed This Encounter  Procedures   XR KNEE 3 VIEW RIGHT   XR Knee 1-2 Views Left   No orders of the defined types were placed in this encounter.     Procedures: Large Joint Inj: bilateral knee on 07/10/2023 1:33 PM Indications: diagnostic evaluation, joint swelling and pain Details: 18 G 1.5 in needle, superolateral approach  Arthrogram: No  Medications (Right): 5 mL lidocaine 1 %; 4 mL  bupivacaine 0.25 %; 40 mg methylPREDNISolone acetate 40 MG/ML Aspirate (Right): 13 mL Medications (Left): 5 mL lidocaine 1 %; 4 mL bupivacaine 0.25 %; 40 mg methylPREDNISolone acetate 40 MG/ML Outcome: tolerated well, no immediate complications Procedure, treatment alternatives, risks and benefits explained, specific risks discussed. Consent was given by the patient. Immediately prior to procedure a time out was called to verify the correct patient, procedure, equipment, support staff and site/side marked as required. Patient was prepped and draped in the usual sterile fashion.       Clinical Data: No additional findings.  Objective: Vital Signs: There were no vitals taken for this visit.  Physical Exam:  Constitutional: Patient appears well-developed HEENT:  Head: Normocephalic Eyes:EOM are normal Neck: Normal range of motion Cardiovascular: Normal rate Pulmonary/chest: Effort normal Neurologic: Patient is alert Skin: Skin is warm Psychiatric: Patient has normal mood and affect  Ortho Exam: Ortho exam demonstrates left knee with 0 degrees extension and 105 degrees of knee flexion.  No significant effusion noted.  Varus alignment noted.  Right knee with 3 degrees extension and 120 degrees of knee flexion.  Small effusion noted in the right knee.  Varus alignment that is not correctable in the right knee.  Able to perform straight leg raise with both lower extremities.  Excellent quad strength bilaterally.  No cellulitis or skin changes noted bilaterally.  Tenderness over the medial compartment in both knees with no significant lateral joint line tenderness.  No pain with hip range of motion bilaterally.  Palpable DP pulse of bilateral lower extremities.  Intact ankle dorsiflexion and plantarflexion of each ankle.  Specialty Comments:  No specialty comments available.  Imaging: No results found.   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   BAKER'S CYST, RIGHT KNEE 04/14/2008   TINEA BARBAE 10/07/2007   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    Family History  Problem Relation Age of Onset   Heart disease Brother        MI s/p stent   Diabetes Father    Hypertension Father    Heart disease Father    Arthritis Father    Hypertension Mother    Stroke Mother 16       left arm weakness, speech improving,  Gout Mother    Arthritis Sister    Arthritis Sister    Cancer Sister    Hypertension Brother    Hearing loss Brother    Hearing loss Son    Heart disease Brother        MI   Hypertension Brother    Colon cancer Neg Hx    Esophageal cancer Neg Hx    Rectal cancer Neg Hx    Stomach cancer Neg Hx     Past Surgical History:  Procedure Laterality Date   APPENDECTOMY     CIRCUMCISION  05/12/2012   Procedure: CIRCUMCISION ADULT;  Surgeon: Valetta Fuller, MD;  Location: [facility];  Service: Urology;  Laterality: N/A;   ROBOT ASSISTED LAPAROSCOPIC RADICAL PROSTATECTOMY  05/12/2012   Procedure: ROBOTIC ASSISTED LAPAROSCOPIC RADICAL PROSTATECTOMY;  Surgeon: Valetta Fuller, MD;  Location: [facility];  Service: Urology;  Laterality: N/A;   TONSILLECTOMY     Social History   Occupational History   Not on file  Tobacco Use   Smoking status: Former    Current packs/day: 0.00    Average packs/day: 0.5 packs/day for 10.0 years (5.0 ttl pk-yrs)     Types: Cigarettes    Start date: 08/04/1981    Quit date: 08/05/1991    Years since quitting: 31.9   Smokeless tobacco: Never  Substance and Sexual Activity   Alcohol use: Yes    Alcohol/week: 2.0 standard drinks of alcohol    Types: 2 Standard drinks or equivalent per week    Comment: 1-2 weekly   Drug use: No   Sexual activity: Yes    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

## 2023-07-22 ENCOUNTER — Other Ambulatory Visit: Payer: Self-pay | Admitting: Medical

## 2023-07-27 NOTE — Telephone Encounter (Signed)
Will submit January 2025 for Orthovisc, bilateral knee

## 2023-08-14 ENCOUNTER — Telehealth: Payer: Self-pay

## 2023-08-14 NOTE — Telephone Encounter (Signed)
 VOB submitted for Orthovisc, bilateral knee

## 2023-08-18 ENCOUNTER — Telehealth: Payer: Self-pay

## 2023-08-18 ENCOUNTER — Other Ambulatory Visit: Payer: Self-pay

## 2023-08-18 DIAGNOSIS — M1711 Unilateral primary osteoarthritis, right knee: Secondary | ICD-10-CM

## 2023-08-18 DIAGNOSIS — M1712 Unilateral primary osteoarthritis, left knee: Secondary | ICD-10-CM

## 2023-08-18 NOTE — Telephone Encounter (Signed)
 FYI-   Talked with patient and advised that [employer] [place] does not cover gel injections at all.   Patient voiced that he understands.

## 2023-09-18 DIAGNOSIS — C61 Malignant neoplasm of prostate: Secondary | ICD-10-CM | POA: Diagnosis not present

## 2023-09-18 DIAGNOSIS — R3121 Asymptomatic microscopic hematuria: Secondary | ICD-10-CM | POA: Diagnosis not present

## 2023-09-18 DIAGNOSIS — N3946 Mixed incontinence: Secondary | ICD-10-CM | POA: Diagnosis not present

## 2024-01-26 ENCOUNTER — Telehealth: Payer: Self-pay

## 2024-01-26 NOTE — Telephone Encounter (Signed)
 Patient called with his insurance on the line concerning approval for [phone] Orthovisc and was advised per his insurance that gel injections are not covered at all.  Advised patient of TriVisc and patient has decided to proceed.  VOB submitted online through [facility] for TriVisc, bilateral knee.  Patient is aware that he will receive a call to pay for medication.

## 2024-01-29 ENCOUNTER — Telehealth: Payer: Self-pay | Admitting: Orthopedic Surgery

## 2024-01-29 NOTE — Telephone Encounter (Signed)
 Pt called about an update fr gel thru other company that it was submitted to. Please call pt Monday about an update at [phone].

## 2024-02-01 NOTE — Telephone Encounter (Signed)
 Talked with patient concerning TriVisc injections.  Provided patient phone number for [employer] to pay for injections. Number provided was [phone]

## 2024-02-03 ENCOUNTER — Telehealth: Payer: Self-pay

## 2024-02-03 DIAGNOSIS — M1712 Unilateral primary osteoarthritis, left knee: Secondary | ICD-10-CM

## 2024-02-03 DIAGNOSIS — M1711 Unilateral primary osteoarthritis, right knee: Secondary | ICD-10-CM

## 2024-02-03 NOTE — Telephone Encounter (Signed)
Called and left a VM for patient to CB to schedule for gel injection with Dr. August Saucer or Franky Macho  See referrals tab

## 2024-02-04 ENCOUNTER — Encounter: Payer: Self-pay | Admitting: Sports Medicine

## 2024-02-04 ENCOUNTER — Ambulatory Visit: Admitting: Sports Medicine

## 2024-02-04 DIAGNOSIS — M25562 Pain in left knee: Secondary | ICD-10-CM | POA: Diagnosis not present

## 2024-02-04 DIAGNOSIS — M25561 Pain in right knee: Secondary | ICD-10-CM

## 2024-02-04 DIAGNOSIS — M170 Bilateral primary osteoarthritis of knee: Secondary | ICD-10-CM

## 2024-02-04 DIAGNOSIS — M1711 Unilateral primary osteoarthritis, right knee: Secondary | ICD-10-CM

## 2024-02-04 DIAGNOSIS — G8929 Other chronic pain: Secondary | ICD-10-CM

## 2024-02-04 DIAGNOSIS — M1712 Unilateral primary osteoarthritis, left knee: Secondary | ICD-10-CM

## 2024-02-04 MED ORDER — SODIUM HYALURONATE (VISCOSUP) 25 MG/2.5ML IX SOSY
25.0000 mg | PREFILLED_SYRINGE | INTRA_ARTICULAR | Status: AC | PRN
  Administered 2024-02-04: 25 mg via INTRA_ARTICULAR

## 2024-02-04 MED ORDER — LIDOCAINE HCL 1 % IJ SOLN
2.0000 mL | INTRAMUSCULAR | Status: AC | PRN
  Administered 2024-02-04: 2 mL

## 2024-02-04 MED ORDER — BUPIVACAINE HCL 0.25 % IJ SOLN
2.0000 mL | INTRAMUSCULAR | Status: AC | PRN
  Administered 2024-02-04: 2 mL via INTRA_ARTICULAR

## 2024-02-04 NOTE — Patient Instructions (Addendum)
 Dr. Burnetta' Guide for Management of Knee Osteoarthritis:  1.)  Keep your body moving - keep working on flexibility and range of motion for the knee.  Good physical activity exercise include: stationary bike, swimming, elliptical.  2.)  Maintain a healthy weight --> 1 pound of body weight = 4 pounds of weight on the knees.  Even small weight loss (5-10+ lbs) can significantly improve pain John Lane - your weight is stable, do not need to work on weight loss)  3.)  Supplements helpful for arthritis and inflammation: Turmeric (curcumin) Boswellia serrata, collagen hydrolysate, Glucosamine-chondroitin  4.)  Foods helpful for arthritis and inflammation: Fish and foods containing omega-3's, leafy greens (broccoli, spinach), citrus fruits (grapefruit, oranges, lemons), green tea, blueberries, cherries, olive oil (EVO)  *It is very important to stay hydrated, drinking lots of water  throughout the day. This helps hydrate structures within the knee.  5.) Medicines:    - Topical pain relievers: Voltaren  gel, Arnica gel, Tiger balm, lidocaine /IcyHot patches    - Anti-inflammatories like Aleve , Motrin , ibuprofen  --> we can consider prescription based NSAIDs as well if needed, such as Meloxicam 

## 2024-02-04 NOTE — Progress Notes (Signed)
 Office & Procedure Note  Patient: John Lane             Date of Birth: Oct 15, 1960           MRN: [id number]             Visit Date: 02/04/2024  HPI: John Lane is a pleasant 63 year old male who presents with bilateral knee osteoarthritis.  He is scheduled for TriVisc hyaluronic acid injections.  He is a patient of Dr. Brion, underwent corticosteroid injections back on 07/09/2023 with [facility] - gave him relief for 6 weeks or so, then pain started to return.  PE:  - Bilateral knees: Positive TTP over the medial joint line right greater than left knee.  There is no significant redness swelling or effusion of the knees.  Bilateral patellar crepitus noted.  Range of motion 0-125 degrees of the right knee, 0-130 degrees of the left knee.  Imaging:  07/09/23: *Did personally review left and right knee x-ray myself and with the patient in the room today  XR Knee 1-2 Views Left AP, lateral, sunrise views of left knee reviewed.  Moderate degenerative  changes of the medial compartment with varus alignment and joint space  narrowing/osteophyte formation.  Mild degenerative changes in the  patellofemoral and lateral compartments.  No fracture or dislocation.  No  abnormal patellar height. XR KNEE 3 VIEW RIGHT AP, lateral, sunrise views of right knee reviewed.  Severe degenerative  changes primarily of the medial and patellofemoral compartments with  osteophyte formation and joint space narrowing.  Varus alignment noted.   No fracture or dislocation.  No abnormal patellar height.  Visit Diagnoses:  1. Unilateral primary osteoarthritis, left knee   2. Unilateral primary osteoarthritis, right knee   3. Bilateral chronic knee pain    Procedures:  Large Joint Inj: R knee on 02/04/2024 10:56 AM Details: 22 G 1.5 in needle, anterolateral approach Medications: 2 mL lidocaine  1 %; 2 mL bupivacaine  0.25 %; 25 mg Sodium Hyaluronate (Viscosup) 25 MG/2.5ML Outcome: tolerated well, no immediate  complications Procedure, treatment alternatives, risks and benefits explained, specific risks discussed. Consent was given by the patient. Patient was prepped and draped in the usual sterile fashion.    Large Joint Inj: L knee on 02/04/2024 10:57 AM Details: 22 G 1.5 in needle, anterolateral approach Medications: 2 mL lidocaine  1 %; 2 mL bupivacaine  0.25 %; 25 mg Sodium Hyaluronate (Viscosup) 25 MG/2.5ML Outcome: tolerated well, no immediate complications Procedure, treatment alternatives, risks and benefits explained, specific risks discussed. Consent was given by the patient. Patient was prepped and draped in the usual sterile fashion.     Plan: - Did review x-rays with Kyl today showing his medial tibiofemoral joint space narrowing, right greater than left knee. - He has received good temporary relief from previous corticosteroid injections in the past, he would like to move forward with viscosupplementation, we did perform TriVisc injections #1 into both the right and left knee today.  Advised on postinjection protocol.  May use ice/heat and/or over-the-counter anti-inflammatories as needed - He does enjoy biking/cycling, I did recommend this as good treatment for the knees.  We did discuss safe healthy activity, did provide my arthritis handout, see AVS - We will bring him back for injections only for a #2 and #3 for TriVisc for bilateral knees - Likely will get him back with Dr. Brien Calix about 6 weeks after our last viscosupplementation injection  Lonell Sprang, DO Primary Care Sports Medicine Physician  [facility] - Orthoped[facility]  This note was dictated using Dragon naturally speaking software and may contain errors in syntax, spelling, or content which have not been identified prior to signing this note.

## 2024-02-12 ENCOUNTER — Encounter: Payer: Self-pay | Admitting: Sports Medicine

## 2024-02-12 ENCOUNTER — Ambulatory Visit (INDEPENDENT_AMBULATORY_CARE_PROVIDER_SITE_OTHER): Admitting: Sports Medicine

## 2024-02-12 DIAGNOSIS — M170 Bilateral primary osteoarthritis of knee: Secondary | ICD-10-CM | POA: Diagnosis not present

## 2024-02-12 DIAGNOSIS — M25562 Pain in left knee: Secondary | ICD-10-CM

## 2024-02-12 DIAGNOSIS — G8929 Other chronic pain: Secondary | ICD-10-CM

## 2024-02-12 DIAGNOSIS — M25561 Pain in right knee: Secondary | ICD-10-CM

## 2024-02-12 MED ORDER — SODIUM HYALURONATE (VISCOSUP) 25 MG/2.5ML IX SOSY
25.0000 mg | PREFILLED_SYRINGE | INTRA_ARTICULAR | Status: AC | PRN
  Administered 2024-02-12: 25 mg via INTRA_ARTICULAR

## 2024-02-12 MED ORDER — SODIUM HYALURONATE (VISCOSUP) 25 MG/2.5ML IX SOSY
25.0000 mg | PREFILLED_SYRINGE | INTRA_ARTICULAR | Status: AC | PRN
Start: 2024-02-12 — End: 2024-02-12
  Administered 2024-02-12: 25 mg via INTRA_ARTICULAR

## 2024-02-12 NOTE — Progress Notes (Signed)
   Procedure Note  Patient: John Lane             Date of Birth: September 08, 1960           MRN: [id number]             Visit Date: 02/12/2024  Procedures: Visit Diagnoses:  1. Bilateral primary osteoarthritis of knee   2. Bilateral chronic knee pain    Large Joint Inj: R knee on 02/12/2024 4:26 PM Indications: pain Details: 22 G 1.5 in needle, anterolateral approach Medications: 25 mg Sodium Hyaluronate (Viscosup) 25 MG/2.5ML Outcome: tolerated well, no immediate complications Procedure, treatment alternatives, risks and benefits explained, specific risks discussed. Consent was given by the patient. Patient was prepped and draped in the usual sterile fashion.    Large Joint Inj: L knee on 02/12/2024 4:27 PM Details: 22 G 1.5 in needle, anterolateral approach Medications: 25 mg Sodium Hyaluronate (Viscosup) 25 MG/2.5ML Outcome: tolerated well, no immediate complications Procedure, treatment alternatives, risks and benefits explained, specific risks discussed. Consent was given by the patient. Patient was prepped and draped in the usual sterile fashion.     - TriVisc injection #2 administered into both the right and left knee today - He will follow-up in just over 1 week for his TriVisc No. 3 of 3 injection - Postinjection protocol discussed - Following his third injection we will get him back with Dr. Brien Calix for follow-up 6-weeks post  John Sprang, DO Primary Care Sports Medicine Physician  [facility] - Orthopedics  This note was dictated using Dragon naturally speaking software and may contain errors in syntax, spelling, or content which have not been identified prior to signing this note.

## 2024-02-19 ENCOUNTER — Telehealth: Payer: Self-pay | Admitting: Sports Medicine

## 2024-02-19 NOTE — Telephone Encounter (Signed)
 Patient called and had questions. He stated you told him that he would not be charged and he got a bill.  CB# [phone]

## 2024-02-23 ENCOUNTER — Encounter: Payer: Self-pay | Admitting: Sports Medicine

## 2024-02-23 ENCOUNTER — Telehealth: Payer: Self-pay | Admitting: Sports Medicine

## 2024-02-23 ENCOUNTER — Telehealth: Payer: Self-pay

## 2024-02-23 ENCOUNTER — Ambulatory Visit (INDEPENDENT_AMBULATORY_CARE_PROVIDER_SITE_OTHER): Admitting: Sports Medicine

## 2024-02-23 DIAGNOSIS — M170 Bilateral primary osteoarthritis of knee: Secondary | ICD-10-CM

## 2024-02-23 DIAGNOSIS — M25561 Pain in right knee: Secondary | ICD-10-CM

## 2024-02-23 DIAGNOSIS — M25562 Pain in left knee: Secondary | ICD-10-CM

## 2024-02-23 DIAGNOSIS — G8929 Other chronic pain: Secondary | ICD-10-CM

## 2024-02-23 MED ORDER — METHYLPREDNISOLONE ACETATE 40 MG/ML IJ SUSP
40.0000 mg | INTRAMUSCULAR | Status: AC | PRN
  Administered 2024-02-23: 40 mg via INTRA_ARTICULAR

## 2024-02-23 MED ORDER — LIDOCAINE HCL 1 % IJ SOLN
1.0000 mL | INTRAMUSCULAR | Status: AC | PRN
  Administered 2024-02-23: 1 mL

## 2024-02-23 MED ORDER — BUPIVACAINE HCL 0.25 % IJ SOLN
2.0000 mL | INTRAMUSCULAR | Status: AC | PRN
  Administered 2024-02-23: 2 mL via INTRA_ARTICULAR

## 2024-02-23 MED ORDER — SODIUM HYALURONATE (VISCOSUP) 25 MG/2.5ML IX SOSY
25.0000 mg | PREFILLED_SYRINGE | INTRA_ARTICULAR | Status: AC | PRN
  Administered 2024-02-23: 25 mg via INTRA_ARTICULAR

## 2024-02-23 NOTE — Addendum Note (Signed)
 Addended by: Janisa Labus W III on: 02/23/2024 03:48 PM   Modules accepted: Level of Service

## 2024-02-23 NOTE — Telephone Encounter (Signed)
See previous message in chart.  

## 2024-02-23 NOTE — Telephone Encounter (Signed)
 Pt advocate called to get CPT code for his injections today to see if they were covered by his insurance. I was able to give them the CPT codes and they handled further insurance questions.

## 2024-02-23 NOTE — Progress Notes (Signed)
   Procedure Note  Patient: John Lane             Date of Birth: 1961-03-19           MRN: [id number]             Visit Date: 02/23/2024  Procedures: Visit Diagnoses:  1. Bilateral primary osteoarthritis of knee   2. Bilateral chronic knee pain    Large Joint Inj: R knee on 02/23/2024 4:33 PM Indications: pain Details: 22 G 1.5 in needle, anterolateral approach Medications: 1 mL lidocaine  1 %; 2 mL bupivacaine  0.25 %; 40 mg methylPREDNISolone  acetate 40 MG/ML; 25 mg Sodium Hyaluronate (Viscosup) 25 MG/2.5ML Outcome: tolerated well, no immediate complications Procedure, treatment alternatives, risks and benefits explained, specific risks discussed. Consent was given by the patient. Patient was prepped and draped in the usual sterile fashion.    Large Joint Inj: L knee on 02/23/2024 4:34 PM Indications: pain Details: 22 G 1.5 in needle, anterolateral approach Medications: 1 mL lidocaine  1 %; 2 mL bupivacaine  0.25 %; 40 mg methylPREDNISolone  acetate 40 MG/ML; 25 mg Sodium Hyaluronate (Viscosup) 25 MG/2.5ML Outcome: tolerated well, no immediate complications Procedure, treatment alternatives, risks and benefits explained, specific risks discussed. Consent was given by the patient. Patient was prepped and draped in the usual sterile fashion.     - Procedurally successful #3 of 3 TriVisc injection with concomitant corticosteroid (this time only) for the right and left knee. - Postinjection protocol discussed - Recommended follow-up/update of improvement at about 6 weeks with Dr. Dean/Luke/myself as desires  Lonell Sprang, DO Primary Care Sports Medicine Physician  [facility] - Orthopedics  This note was dictated using Dragon naturally speaking software and may contain errors in syntax, spelling, or content which have not been identified prior to signing this note.

## 2024-02-23 NOTE — Telephone Encounter (Signed)
 Talked with patient and advised him that he is charged for the medication to be administered and he could call billing to have his bill explained and his insurance.  Patient voiced that he understands.

## 2024-02-23 NOTE — Telephone Encounter (Signed)
 Patient called and wants to understand why he got a bill when you told him he doesn't have to pay for office visit he stated. CB#[phone]

## 2024-06-06 ENCOUNTER — Encounter: Payer: Self-pay | Admitting: Radiology

## 2024-06-22 ENCOUNTER — Telehealth: Payer: Self-pay | Admitting: Sports Medicine

## 2024-06-22 ENCOUNTER — Telehealth: Payer: Self-pay | Admitting: Radiology

## 2024-06-22 NOTE — Progress Notes (Unsigned)
 Patient called LMVM for me, and I called him back. He had questions about his bill from previous Trivisc injections.   I explained to him that there is an admin fee as well, and that it went to his deductible.   I could not find the BV for him, or a copy of insurance card either; will have April J look into this one after first of year and see if any single shot will be an option for him. To cut down on the OOP cost of the [employer].

## 2024-06-22 NOTE — Telephone Encounter (Signed)
 Pt called requesting a call from April Jackson. Pt states he has some questions about gel injections. Pt phone number is [phone].

## 2024-06-22 NOTE — Telephone Encounter (Signed)
Talked with patient concerning gel injection.  

## 2024-06-23 ENCOUNTER — Ambulatory Visit: Payer: Self-pay | Admitting: Medical

## 2024-06-23 ENCOUNTER — Ambulatory Visit: Payer: BC Managed Care – PPO | Admitting: Medical

## 2024-06-23 VITALS — BP 130/82 | HR 77 | Temp 97.6°F | Resp 15 | Ht 65.0 in | Wt 147.6 lb

## 2024-06-23 DIAGNOSIS — Z8546 Personal history of malignant neoplasm of prostate: Secondary | ICD-10-CM | POA: Diagnosis not present

## 2024-06-23 DIAGNOSIS — Z23 Encounter for immunization: Secondary | ICD-10-CM

## 2024-06-23 DIAGNOSIS — Z1211 Encounter for screening for malignant neoplasm of colon: Secondary | ICD-10-CM

## 2024-06-23 DIAGNOSIS — Z0000 Encounter for general adult medical examination without abnormal findings: Secondary | ICD-10-CM

## 2024-06-23 LAB — PSA: PSA: 0.01 ng/mL — ABNORMAL LOW (ref 0.10–4.00)

## 2024-06-23 LAB — COMPREHENSIVE METABOLIC PANEL WITH GFR
ALT: 21 U/L (ref 0–53)
AST: 22 U/L (ref 0–37)
Albumin: 4.5 g/dL (ref 3.5–5.2)
Alkaline Phosphatase: 53 U/L (ref 39–117)
BUN: 12 mg/dL (ref 6–23)
CO2: 30 meq/L (ref 19–32)
Calcium: 9.5 mg/dL (ref 8.4–10.5)
Chloride: 102 meq/L (ref 96–112)
Creatinine, Ser: 0.79 mg/dL (ref 0.40–1.50)
GFR: 94.47 mL/min (ref >60.00)
Glucose, Bld: 88 mg/dL (ref 70–99)
Potassium: 4.8 meq/L (ref 3.5–5.1)
Sodium: 139 meq/L (ref 135–145)
Total Bilirubin: 0.6 mg/dL (ref 0.2–1.2)
Total Protein: 7 g/dL (ref 6.0–8.3)

## 2024-06-23 LAB — CBC WITH DIFFERENTIAL/PLATELET
Basophils Absolute: 0 K/uL (ref 0.0–0.1)
Basophils Relative: 0.2 % (ref 0.0–3.0)
Eosinophils Absolute: 0.2 K/uL (ref 0.0–0.7)
Eosinophils Relative: 3 % (ref 0.0–5.0)
HCT: 44 % (ref 39.0–52.0)
Hemoglobin: 15.1 g/dL (ref 13.0–17.0)
Lymphocytes Relative: 58.8 % — ABNORMAL HIGH (ref 12.0–46.0)
Lymphs Abs: 3.6 K/uL (ref 0.7–4.0)
MCHC: 34.4 g/dL (ref 30.0–36.0)
MCV: 89.5 fl (ref 78.0–100.0)
Monocytes Absolute: 0.3 K/uL (ref 0.1–1.0)
Monocytes Relative: 4.5 % (ref 3.0–12.0)
Neutro Abs: 2 K/uL (ref 1.4–7.7)
Neutrophils Relative %: 33.5 % — ABNORMAL LOW (ref 43.0–77.0)
Platelets: 204 K/uL (ref 150.0–400.0)
RBC: 4.92 Mil/uL (ref 4.22–5.81)
RDW: 12.5 % (ref 11.5–15.5)
WBC: 6 K/uL (ref 4.0–10.5)

## 2024-06-23 LAB — LIPID PANEL
Cholesterol: 202 mg/dL — ABNORMAL HIGH (ref 0–200)
HDL: 49 mg/dL (ref >39.00)
LDL Cholesterol: 130 mg/dL — ABNORMAL HIGH (ref 0–99)
NonHDL: 152.84
Total CHOL/HDL Ratio: 4
Triglycerides: 115 mg/dL (ref 0.0–149.0)
VLDL: 23 mg/dL (ref 0.0–40.0)

## 2024-06-23 MED ORDER — ATORVASTATIN CALCIUM 10 MG PO TABS: 10.0000 mg | ORAL_TABLET | Freq: Every day | ORAL | 3 refills | Status: AC

## 2024-06-23 NOTE — Progress Notes (Signed)
 Subjective:    Patient ID: John Lane, male    DOB: March 30, 1961, 63 y.o.   MRN: [id number]  HPI  Pt states he has been exercising little bit. Doing some push ups and abdomen exercise. Pt states he walks a lot at work. He does not know how many steps he gets on average. Pt states moderate healthy diet. Avoid fried foods. Rare/random occasional beer. 2 cups of coffee a day. Non smoker.    Pt states he is not due for colonoscopy. He was told by GI office but he thought he was due. On check of epic he is due so resent referral today again for clarification.  Will get pda today.   He will get flu vaccine today. He will get TD and pneumonia later.      Review of Systems  Constitutional:  Negative for chills, fatigue and fever.  HENT:  Negative for congestion, ear discharge, ear pain, hearing loss, nosebleeds, rhinorrhea and trouble swallowing.   Respiratory:  Negative for cough, chest tightness, shortness of breath and wheezing.   Cardiovascular:  Negative for chest pain and palpitations.  Gastrointestinal:  Negative for abdominal pain, blood in stool, diarrhea, nausea and rectal pain.  Genitourinary:  Negative for dysuria, flank pain and genital sores.  Musculoskeletal:  Negative for back pain.  Skin:  Negative for rash.  Neurological:  Negative for dizziness, syncope, weakness and numbness.  Hematological:  Negative for adenopathy. Does not bruise/bleed easily.  Psychiatric/Behavioral:  Negative for behavioral problems, dysphoric mood, self-injury and suicidal ideas. The patient is not nervous/anxious.     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     Social History   Socioeconomic History   Marital status: Married    Spouse name: Not on file   Number of children: Not  on file   Years of education: Not on file   Highest education level: Not on file  Occupational History   Not on file  Tobacco Use   Smoking status: Former    Current packs/day: 0.00    Average packs/day: 0.5 packs/day for 10.0 years (5.0 ttl pk-yrs)    Types: Cigarettes    Start date: 08/04/1981    Quit date: 08/05/1991    Years since quitting: 32.9   Smokeless tobacco: Never  Substance and Sexual Activity   Alcohol use: Yes    Alcohol/week: 2.0 standard drinks of alcohol    Types: 2 Standard drinks or equivalent per week    Comment: 1-2 weekly   Drug use: No   Sexual activity: Yes    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, no dietary restrictions, automotive engineer  Other Topics Concern   Not on file  Social History Narrative   Married   Former smoker   Quit over 18 years ago-smoked for 10 years 1/2 ppd   3 children   Social Drivers of Corporate Investment Banker Strain: Not on file  Food Insecurity: Not on file  Transportation Needs: Not on file  Physical Activity: Not on file  Stress: Not on file  Social Connections: Not on file  Intimate Partner Violence: Not on file    Past Surgical History:  Procedure Laterality Date   APPENDECTOMY  CIRCUMCISION  05/12/2012   Procedure: CIRCUMCISION ADULT;  Surgeon: Alm GORMAN Fragmin, MD;  Location: [facility];  Service: Urology;  Laterality: N/A;   ROBOT ASSISTED LAPAROSCOPIC RADICAL PROSTATECTOMY  05/12/2012   Procedure: ROBOTIC ASSISTED LAPAROSCOPIC RADICAL PROSTATECTOMY;  Surgeon: Alm GORMAN Fragmin, MD;  Location: [facility];  Service: Urology;  Laterality: N/A;   TONSILLECTOMY      Family History  Problem Relation Age of Onset   Heart disease Brother        MI s/p stent   Diabetes Father    Hypertension Father    Heart disease Father    Arthritis Father    Hypertension Mother    Stroke Mother 96       left arm weakness, speech improving,    Gout Mother    Arthritis Sister    Arthritis Sister    Cancer Sister     Hypertension Brother    Hearing loss Brother    Hearing loss Son    Heart disease Brother        MI   Hypertension Brother    Colon cancer Neg Hx    Esophageal cancer Neg Hx    Rectal cancer Neg Hx    Stomach cancer Neg Hx     No Known Allergies  Current Outpatient Medications on File Prior to Visit  Medication Sig Dispense Refill   acetaminophen  (TYLENOL ) 500 MG tablet Take 1 tablet (500 mg total) by mouth 3 (three) times daily as needed. 90 tablet 5   atorvastatin  (LIPITOR) 10 MG tablet Take 1 tablet by mouth once daily 30 tablet 0   BAYER ASPIRIN  EC LOW DOSE 81 MG EC tablet TAKE ONE TABLET BY MOUTH ONCE DAILY AS NEEDED FOR PAIN 400 tablet 0   cetirizine  (ZYRTEC ) 5 MG tablet Take 1 tablet (5 mg total) by mouth daily. 30 tablet 3   Cholecalciferol (VITAMIN D PO) Take by mouth.     fluticasone  (FLONASE ) 50 MCG/ACT nasal spray Place 2 sprays into both nostrils daily. 48 g 3   lidocaine  4 % Place 1 patch onto the skin daily. 7 patch 0   meloxicam  (MOBIC ) 7.5 MG tablet Take 1 tablet (7.5 mg total) by mouth daily. 20 tablet 0   Multiple Vitamins-Minerals (AIRBORNE GUMMIES PO)      Multiple Vitamins-Minerals (GNP MEGA MULTI FOR MEN) TABS [employer]NC Mega Men Performance & Vitality 30 packs. Take as directed 30 tablet 1   tadalafil  (CIALIS ) 5 MG tablet Take 1 tablet (5 mg total) by mouth daily as needed for erectile dysfunction. 30 tablet 0   Turmeric Curcumin 500 MG CAPS Take 500 mg by mouth daily. 250 capsule 1   No current facility-administered medications on file prior to visit.    BP 130/82   Pulse 77   Temp 97.6 F (36.4 C) (Oral)   Resp 15   Ht 5' 5 (1.651 m)   Wt 147 lb 9.6 oz (67 kg)   SpO2 97%   BMI 24.56 kg/m         Objective:   Physical Exam    General Mental Status- Alert. General Appearance- Not in acute distress.   Skin General: Color- Normal Color. Moisture- Normal Moisture.  Neck  No JVD.  Chest and Lung Exam Auscultation: Breath  Sounds:-Normal.  Cardiovascular Auscultation:Rythm- Regular. Murmurs & Other Heart Sounds:Auscultation of the heart reveals- No Murmurs.  Abdomen Inspection:-Inspeection Normal. Palpation/Percussion:Note:No mass. Palpation and Percussion of the abdomen reveal- Non Tender, Non Distended + BS, no rebound  or guarding. Pt here for annual wellness exam.   Neurologic Cranial Nerve exam:- CN III-XII intact(No nystagmus), symmetric smile. Strength:- 5/5 equal and symmetric strength both upper and lower extremities.      Assessment & Plan:   For you wellness exam today I have ordered cbc, cmp and lipid panel.   Psa recheck due to hx of prostate CA.    Place referral to GI MD so you can get repeat colonoscopy. Please call them directly as well.   Vaccine given today. Flu vaccine today.    Recommend exercise and healthy diet.  Follow up date to be determined after lab review  Dallas Maxwell, PA-C

## 2024-06-23 NOTE — Patient Instructions (Signed)
 For you wellness exam today I have ordered cbc, cmp and lipid panel.   Psa recheck due to hx of prostate CA.    Place referral to GI MD so you can get repeat colonoscopy. Please call them directly as well.   Vaccine given today. Flu vaccine today.    Recommend exercise and healthy diet.  Follow up date to be determined after lab review  Preventive Care 109-63 Years Old, Male Preventive care refers to lifestyle choices and visits with your health care provider that can promote health and wellness. Preventive care visits are also called wellness exams. What can I expect for my preventive care visit? Counseling During your preventive care visit, your health care provider may ask about your: Medical history, including: Past medical problems. Family medical history. Current health, including: Emotional well-being. Home life and relationship well-being. Sexual activity. Lifestyle, including: Alcohol, nicotine or tobacco, and drug use. Access to firearms. Diet, exercise, and sleep habits. Safety issues such as seatbelt and bike helmet use. Sunscreen use. Work and work astronomer. Physical exam Your health care provider will check your: Height and weight. These may be used to calculate your BMI (body mass index). BMI is a measurement that tells if you are at a healthy weight. Waist circumference. This measures the distance around your waistline. This measurement also tells if you are at a healthy weight and may help predict your risk of certain diseases, such as type 2 diabetes and high blood pressure. Heart rate and blood pressure. Body temperature. Skin for abnormal spots. What immunizations do I need?  Vaccines are usually given at various ages, according to a schedule. Your health care provider will recommend vaccines for you based on your age, medical history, and lifestyle or other factors, such as travel or where you work. What tests do I need? Screening Your health care  provider may recommend screening tests for certain conditions. This may include: Lipid and cholesterol levels. Diabetes screening. This is done by checking your blood sugar (glucose) after you have not eaten for a while (fasting). Hepatitis B test. Hepatitis C test. HIV (human immunodeficiency virus) test. STI (sexually transmitted infection) testing, if you are at risk. Lung cancer screening. Prostate cancer screening. Colorectal cancer screening. Talk with your health care provider about your test results, treatment options, and if necessary, the need for more tests. Follow these instructions at home: Eating and drinking  Eat a diet that includes fresh fruits and vegetables, whole grains, lean protein, and low-fat dairy products. Take vitamin and mineral supplements as recommended by your health care provider. Do not drink alcohol if your health care provider tells you not to drink. If you drink alcohol: Limit how much you have to 0-2 drinks a day. Know how much alcohol is in your drink. In the [place] one drink equals one 12 oz bottle of beer (355 mL), one 5 oz glass of wine (148 mL), or one 1 oz glass of hard liquor (44 mL). Lifestyle Brush your teeth every morning and night with fluoride toothpaste. Floss one time each day. Exercise for at least 30 minutes 5 or more days each week. Do not use any products that contain nicotine or tobacco. These products include cigarettes, chewing tobacco, and vaping devices, such as e-cigarettes. If you need help quitting, ask your health care provider. Do not use drugs. If you are sexually active, practice safe sex. Use a condom or other form of protection to prevent STIs. Take aspirin  only as told by your health  care provider. Make sure that you understand how much to take and what form to take. Work with your health care provider to find out whether it is safe and beneficial for you to take aspirin  daily. Find healthy ways to manage stress, such  as: Meditation, yoga, or listening to music. Journaling. Talking to a trusted person. Spending time with friends and family. Minimize exposure to UV radiation to reduce your risk of skin cancer. Safety Always wear your seat belt while driving or riding in a vehicle. Do not drive: If you have been drinking alcohol. Do not ride with someone who has been drinking. When you are tired or distracted. While texting. If you have been using any mind-altering substances or drugs. Wear a helmet and other protective equipment during sports activities. If you have firearms in your house, make sure you follow all gun safety procedures. What's next? Go to your health care provider once a year for an annual wellness visit. Ask your health care provider how often you should have your eyes and teeth checked. Stay up to date on all vaccines. This information is not intended to replace advice given to you by your health care provider. Make sure you discuss any questions you have with your health care provider. Document Revised: 01/16/2021 Document Reviewed: 01/16/2021 Elsevier Patient Education  2024 [employer].

## 2024-06-23 NOTE — Addendum Note (Signed)
 Addended by: DORINA DALLAS HERO on: 06/23/2024 06:50 PM   Modules accepted: Orders

## 2024-06-23 NOTE — Addendum Note (Signed)
 Addended by: GERARD CHUCKIE SAILOR on: 06/23/2024 08:53 AM   Modules accepted: Orders

## 2024-07-07 ENCOUNTER — Ambulatory Visit: Admitting: Sports Medicine

## 2024-08-22 ENCOUNTER — Ambulatory Visit: Admitting: Surgical

## 2024-09-05 ENCOUNTER — Encounter: Payer: Self-pay | Admitting: Gastroenterology

## 2024-10-10 ENCOUNTER — Ambulatory Visit: Admitting: Surgical

## 2025-06-27 ENCOUNTER — Encounter: Admitting: Medical
